# Patient Record
Sex: Female | Born: 1955 | Race: Black or African American | Hispanic: No | Smoking: Former smoker
Health system: Southern US, Community
[De-identification: ages and names within clinical notes are randomized; demographics above are authoritative.]

## PROBLEM LIST (undated history)

## (undated) DIAGNOSIS — N289 Disorder of kidney and ureter, unspecified: Secondary | ICD-10-CM

## (undated) DIAGNOSIS — I509 Heart failure, unspecified: Secondary | ICD-10-CM

## (undated) DIAGNOSIS — M722 Plantar fascial fibromatosis: Secondary | ICD-10-CM

## (undated) DIAGNOSIS — M109 Gout, unspecified: Secondary | ICD-10-CM

## (undated) DIAGNOSIS — I10 Essential (primary) hypertension: Secondary | ICD-10-CM

## (undated) DIAGNOSIS — G629 Polyneuropathy, unspecified: Secondary | ICD-10-CM

## (undated) DIAGNOSIS — J45909 Unspecified asthma, uncomplicated: Secondary | ICD-10-CM

## (undated) HISTORY — PX: COLONOSCOPY: SHX174

## (undated) HISTORY — PX: AV FISTULA PLACEMENT: SHX1204

## (undated) HISTORY — PX: ABDOMINAL HYSTERECTOMY: SHX81

---

## 2001-05-07 ENCOUNTER — Emergency Department (HOSPITAL_COMMUNITY): Admission: EM | Admit: 2001-05-07 | Discharge: 2001-05-07 | Payer: Self-pay | Admitting: *Deleted

## 2001-05-07 ENCOUNTER — Encounter: Payer: Self-pay | Admitting: *Deleted

## 2001-05-09 ENCOUNTER — Ambulatory Visit (HOSPITAL_COMMUNITY): Admission: RE | Admit: 2001-05-09 | Discharge: 2001-05-09 | Payer: Self-pay | Admitting: Internal Medicine

## 2001-05-09 ENCOUNTER — Encounter: Payer: Self-pay | Admitting: Internal Medicine

## 2001-05-19 ENCOUNTER — Emergency Department (HOSPITAL_COMMUNITY): Admission: EM | Admit: 2001-05-19 | Discharge: 2001-05-19 | Payer: Self-pay | Admitting: Emergency Medicine

## 2001-05-23 ENCOUNTER — Ambulatory Visit (HOSPITAL_COMMUNITY): Admission: RE | Admit: 2001-05-23 | Discharge: 2001-05-23 | Payer: Self-pay | Admitting: Internal Medicine

## 2001-05-26 ENCOUNTER — Ambulatory Visit (HOSPITAL_COMMUNITY): Admission: RE | Admit: 2001-05-26 | Discharge: 2001-05-26 | Payer: Self-pay | Admitting: Internal Medicine

## 2002-04-28 ENCOUNTER — Emergency Department (HOSPITAL_COMMUNITY): Admission: EM | Admit: 2002-04-28 | Discharge: 2002-04-28 | Payer: Self-pay | Admitting: *Deleted

## 2002-06-11 ENCOUNTER — Encounter (HOSPITAL_COMMUNITY): Admission: RE | Admit: 2002-06-11 | Discharge: 2002-07-11 | Payer: Self-pay | Admitting: Rheumatology

## 2002-07-15 ENCOUNTER — Encounter (HOSPITAL_COMMUNITY): Admission: RE | Admit: 2002-07-15 | Discharge: 2002-08-14 | Payer: Self-pay | Admitting: Rheumatology

## 2002-07-15 ENCOUNTER — Encounter: Payer: Self-pay | Admitting: Rheumatology

## 2002-08-04 ENCOUNTER — Encounter: Payer: Self-pay | Admitting: Emergency Medicine

## 2002-08-04 ENCOUNTER — Inpatient Hospital Stay (HOSPITAL_COMMUNITY): Admission: EM | Admit: 2002-08-04 | Discharge: 2002-08-07 | Payer: Self-pay | Admitting: Emergency Medicine

## 2002-08-06 ENCOUNTER — Encounter: Payer: Self-pay | Admitting: *Deleted

## 2002-08-09 ENCOUNTER — Emergency Department (HOSPITAL_COMMUNITY): Admission: EM | Admit: 2002-08-09 | Discharge: 2002-08-09 | Payer: Self-pay | Admitting: Emergency Medicine

## 2002-08-14 ENCOUNTER — Ambulatory Visit (HOSPITAL_COMMUNITY): Admission: RE | Admit: 2002-08-14 | Discharge: 2002-08-14 | Payer: Self-pay | Admitting: Internal Medicine

## 2002-08-14 ENCOUNTER — Encounter: Payer: Self-pay | Admitting: Internal Medicine

## 2002-08-20 ENCOUNTER — Encounter (HOSPITAL_COMMUNITY): Admission: RE | Admit: 2002-08-20 | Discharge: 2002-09-19 | Payer: Self-pay | Admitting: Rheumatology

## 2003-05-27 ENCOUNTER — Emergency Department (HOSPITAL_COMMUNITY): Admission: EM | Admit: 2003-05-27 | Discharge: 2003-05-27 | Payer: Self-pay | Admitting: *Deleted

## 2003-11-15 ENCOUNTER — Ambulatory Visit (HOSPITAL_COMMUNITY): Admission: RE | Admit: 2003-11-15 | Discharge: 2003-11-15 | Payer: Self-pay | Admitting: Unknown Physician Specialty

## 2004-06-16 ENCOUNTER — Emergency Department (HOSPITAL_COMMUNITY): Admission: EM | Admit: 2004-06-16 | Discharge: 2004-06-16 | Payer: Self-pay | Admitting: Emergency Medicine

## 2009-02-07 ENCOUNTER — Ambulatory Visit (HOSPITAL_COMMUNITY): Admission: RE | Admit: 2009-02-07 | Discharge: 2009-02-07 | Payer: Self-pay | Admitting: Nephrology

## 2009-06-05 ENCOUNTER — Emergency Department (HOSPITAL_COMMUNITY): Admission: EM | Admit: 2009-06-05 | Discharge: 2009-06-05 | Payer: Self-pay | Admitting: Emergency Medicine

## 2011-02-18 LAB — POCT I-STAT, CHEM 8
Calcium, Ion: 1.22 mmol/L (ref 1.12–1.32)
Glucose, Bld: 115 mg/dL — ABNORMAL HIGH (ref 70–99)
HCT: 36 % (ref 36.0–46.0)
Hemoglobin: 12.2 g/dL (ref 12.0–15.0)
TCO2: 23 mmol/L (ref 0–100)

## 2011-03-30 NOTE — Discharge Summary (Signed)
   NAME:  Julie [name], Julie [name]                         ACCOUNT NO.:  [id number]   MEDICAL RECORD NO.:  [id number]                   PATIENT TYPE:  INP   LOCATION:  2003                                 FACILITY:  [facility]   PHYSICIAN:  Tesfaye D. Legrand Rams, M.D.              DATE OF BIRTH:  [birth year]   DATE OF ADMISSION:  08/04/2002  DATE OF DISCHARGE:  08/07/2002                                 DISCHARGE SUMMARY   DISCHARGE DIAGNOSES:  1. Chest pain with positive stress test finding.  2. Hypertension.  3. Arthritis.  4. Obesity.  5. Nicotine addiction.   DISPOSITION:  The patient was transferred to [facility] for cardiac  catheterization and further evaluation.   HOSPITAL COURSE:  This is a 55 year old black female with a history of  hypertension and morbid obesity admitted to [facility] with  complaint of chest pain which was radiating to her left arm.  She was  admitted under telemetry and serial EKG and cardiac enzymes were done.  The  EKG and cardiac enzymes were negative for acute ischemic changes.  She had a  stress test which was done by cardiology.  The stress test was said to be  positive and she was transferred to [facility] for cardiac  catheterization and further evaluation.                                               Tesfaye D. Legrand Rams, M.D.    TDF/MEDQ  D:  09/09/2002  T:  09/10/2002  Job:  [id number]

## 2011-03-30 NOTE — Consult Note (Signed)
NAME:  Julie [name], Julie [name] NO.:  [id number]   MEDICAL RECORD NO.:  [id number]                   PATIENT TYPE:   LOCATION:                                       FACILITY:   PHYSICIAN:  Lindaann Slough, M.D.            DATE OF BIRTH:   DATE OF CONSULTATION:  DATE OF DISCHARGE:                                   CONSULTATION   CHIEF COMPLAINT:  Joint pain, stiff.   REASON FOR CONSULTATION:  Julie [name] is a 55 year old black female who has  had several months of aching and hurting in the bilateral knees. She had an  MRI of one of the knees in 1998 but I cannot find the results of this  examination. She finds that going up steps makes her knees worse. She has  had some locking or catching sensation to one or both of the knees  occasionally. She feels that they have not particularly been swollen. The  pain can go down the legs. She reports that her hands hurt and there has  been some swelling. She has had a nerve conduction study and tells me that  there is some nerve damage or tendonitis to the little finger side of the  right hand. She has stiff joints for about one hour in the morning. Her back  will hurt somewhat frequently but the pain goes away quickly.   REVIEW OF SYSTEMS:  She denied fever, rashes, or any weight loss. Her energy  level is described as pretty good. She has some pain that will cause  disrupted sleep occasionally. She has headaches two to three times a week.  She also describes having an alternating pattern of diarrhea and  constipation but no blood or mucous to the bowel movement. She has some  slight chest pain. Denies shortness of breath.   PAST MEDICAL/SURGICAL HISTORY:  1. Hysterectomy.  2. Hypertension.  3. Her blood pressure problems are severe.   LABORATORY DATA:  Creatinine 2.3 in June of 2002. This improved to 1.6 in  October 2002. In June 2002 WBC was 7.1, HGB 13.9, TLT 267. She has had an  ultrasound of the kidneys  showing normal cortexes and she has a horseshoe  type of kidney.   MEDICATIONS:  1. Lotensin 40 mg daily.  2. Triamterine/HCTZ 75/50 mg daily.  3. ? Premarin.   ALLERGIES:  No known drug allergies.   FAMILY HISTORY:  Her father is a 33 year old and is not doing well with  heart problems. Her mother is a 36 year old and has diabetes mellitus,  hypertension, and a diagnosis of RA.   SOCIAL HISTORY:  She grew up in [place] and spent her last teenage years in  [place]. She is married and has children. 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 She smoked cigarettes for many years and  quit four to five months ago. No alcohol.  PHYSICAL EXAMINATION:  VITAL SIGNS: Weight 256 pounds. Blood pressure  170/108, respiratory rate 16, pulse 74.  GENERAL: Overall healthy appearing.  SKIN: Clear.  HEENT: Pupils are equal, round, and reactive to light and accommodation.  Extraocular muscles intact. Mouth clear.  NECK: No jugular venous distention.  LUNGS: Clear.  HEART: Regular rate and rhythm.  No murmur, rub, or gallop.  ABDOMEN: Obese, soft, nontender.  MS: The hands show no arthritic swelling. Are cool and nontender. Wrist,  elbow, shoulders, and neck with good range of motion  and there was no  resistance. Trigger points around the shoulder, neck, upper paraspinous  muscles were mildly tender. Hips with good range of motion. The right knee  was cool. She had no effusions and there was no tenderness with full flexion  to 130 degrees. There was mild lateral joint line tenderness. The left knee  was also cool. There was more moderate medial joint line tenderness. There  was no effusion and the stability was good. There was no pain with full  flexion at 130 degrees. The ankles showed no edema and were nontender. The  feet were mildly tender but not arthritically swollen.  NEURO: Strength is 5/5. DTRs are 2+ throughout with negative SLR.   ASSESSMENT/PLAN:  1. Knee  pain. We will have the knees x-rayed to rule out significant     osteoarthritis. The cause of the prior elevations of the creatinine, I     would keep her off NSAIDS until we know what this status is. I have given     her a prescription for Darvocet N-100 one three times a day as needed. I     am also setting her up for PT to run some strengthening exercises for the     thighs to help the knees. She does have the history of locking but the     examination seemed quite good and the joint line tenderness really was     quite mild presently.  2. Hypertension with prior CRI. We will check a CBC and C-met today. We did     call the pharmacy where she picks up her medicines and what we find is     that she picked up her Lotensin on November 13, 2001, receiving #30 tabs.     The next time she got the prescription filled was on April 17, 2002 and got     #30 tabs. She also got #30 tabs of Norvasc 30 mg on February 04, 2002 and     the only other prescription obtained during this period of time was     Premarin one time. I have asked her about this and she says that she has     not been taking her medicines. I have spent extra time counseling with     her, the importance of taking the medicine in hopes of not eventually     going on dialysis.  3. Obesity. I have encouraged her to try and lose some weight. I have     suggested cutting back a slight degree to where she might lose one or two     pounds a month. Over a year, this could be a significant approximate 25     pound weight loss.   Thank you Dr. Legrand Rams for this consultation. Julie [name] knee examination  overall, looks good. I will see how she does with the Darvocet and PT. I am  hoping that we will avoid the need for  an MRI. I would give some  consideration to a knee injection. She will return in two months.                                               Lindaann Slough, M.D.   WWT/MEDQ  D:  07/09/2002  T:  07/10/2002  Job:  [id number]   cc:    Tesfaye D. Legrand Rams, M.D.

## 2011-03-30 NOTE — Consult Note (Signed)
NAME:  Julie [name], Julie [name]                         ACCOUNT NO.:  [id number]   MEDICAL RECORD NO.:  [id number]                   PATIENT TYPE:  INP   LOCATION:  A206                                 FACILITY:  [facility]   PHYSICIAN:  Signa Kell, M.D. [facility]         DATE OF BIRTH:  07-Jul-1956   DATE OF CONSULTATION:  08/05/2002  DATE OF DISCHARGE:                                   CONSULTATION   REASON FOR CONSULTATION:  The patient is a pleasant 55 year old divorced  black female admitted on August 04, 2002 with chest and left arm pain  associated with severe nausea with some dizziness.  The pain resolved after  four to five minutes but she felt somewhat weak for the next hour or so.  The patient has no exertional symptoms.  She did have a somewhat similar  episode a year ago, at which time she had an echocardiogram.  We do not have  these records.  She feels fine today with no symptoms.  Her EKGs have shown  no acute change except for slight T wave change in V1, which are probably  related to lead placement.  Enzymes have been negative.   PAST MEDICAL HISTORY:  Past history reveals she has hypertension, borderline  diabetes, hypercholesterolemia, GERD and partial hysterectomy.   MEDICATIONS:  Medications include Lortab, Norvasc 5 mg, aspirin, Lotensin 20  mg, hydrochlorothiazide 25 mg.   SOCIAL HISTORY:  She is divorced with four children.  She previously smoked.  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   FAMILY HISTORY:  Mother is 105 and took heart medications.  One brother had  MI at age 67.   REVIEW OF SYSTEMS:  HEENT:  Head, eyes, ears, nose and throat unremarkable.  CARDIORESPIRATORY:  AS noted above.  GU:  Negative.  GI:  Negative except as  above.  MUSCULOSKELETAL:  Arthralgias, joint swelling and pain.   ALLERGIES:  None.   PHYSICAL EXAMINATION:  VITAL SIGNS:  Blood pressure 112/59, pulse 70, normal  sinus rhythm, respirations 20.  GENERAL APPEARANCE:  She is obese, in no  distress.  HEENT:  Unremarkable.  NECK:  JVP is not elevated.  Carotid pulses are bounding and without bruits.  LUNGS:  Clear.  CARDIAC:  There is a 1/6 short systolic murmur at left sternal border.  No  other murmur.  ABDOMEN:  Unremarkable.  EXTREMITIES:  No edema.   LABORATORY AND ACCESSORY CLINICAL DATA:  EKG is within normal limits.   Chest x-ray revealed chronic bronchitis, no acute abnormality.   Labs:  CBC and BMET were normal.   IMPRESSION:  1. Chest pain, undetermined etiology, rule out coronary artery disease     versus esophageal disease.  2. Hypertension.  3. Borderline diabetes.  4. Family history of coronary artery disease.  5. Cigarettes.  6. Gastroesophageal reflux disease.  7. Hyperlipidemia.   Although the symptoms are somewhat worrisome, she has had one episode with  no electrocardiographic or enzyme changes and I have suggested stress  Cardiolite.  Certainly, if this were to be negative, we would not need to  consider catheterization.  If there is any question, cath should be done.   She should have lipid profile and if her LDL is not below [id number], certainly I  think a statin would be indicated.   I appreciate the opportunity to share in this nice patient's care.                                               Signa Kell, M.D. [facility]    EJL/MEDQ  D:  08/05/2002  T:  08/05/2002  Job:  [id number]

## 2011-03-30 NOTE — H&P (Signed)
NAME:  Julie [name], Julie [name]                         ACCOUNT NO.:  [id number]   MEDICAL RECORD NO.:  [id number]                   PATIENT TYPE:  INP   LOCATION:  A206                                 FACILITY:  [facility]   PHYSICIAN:  Tesfaye D. Legrand Rams, M.D.              DATE OF BIRTH:  1956-06-20   DATE OF ADMISSION:  08/04/2002  DATE OF DISCHARGE:                                HISTORY & PHYSICAL   CHIEF COMPLAINT:  Chest pain.   HISTORY OF PRESENT ILLNESS:  This is a 55 year old, African-American female  with known case of hypertension, obesity and nicotine addiction who came to  the emergency room with the above complaint.  The patient was in her usual  state of health until the day of admission when she suddenly developed left-  sided chest pain.  The pain was a pressure type and lasted for a few  minutes.  She went to her work place and started getting frequent pain.  She  was then brought to the emergency room where she was evaluated.  Her initial  EKG and cardiac enzymes were within normal limits.  She has some nausea and  headache.  No shortness of breath or cough noted.  Due to the patient's high  risk for coronary artery disease, it was decided to admit her and do serial  EKG and cardiac enzymes.   REVIEW OF SYMPTOMS:  GENERAL:  No fever or chills.  RESPIRATORY:  No cough,  shortness of breath or palpitations.  GI:  Nausea with no vomiting.  No  abdominal pain, dysuria, urgency or frequency of urination.   PAST MEDICAL HISTORY:  1. Hypertension.  2. Arthritis.  3. Obesity.  4. Nicotine addiction.   MEDICATIONS:  1. Lotensin HCT 20/25 one tablet p.o. q.d.  2. Norvasc 5 mg p.o. q.d.  3. Darvocet-N 100 one tablet p.o. q.6h. p.r.n.   SOCIAL HISTORY:  The patient is married.  She smokes 1/2 pack of cigarettes  every day for the last several years.  No history of alcohol or substance  abuse.   PHYSICAL EXAMINATION:  GENERAL:  The patient is alert, wake.  Obese.  VITAL SIGNS:   Blood pressure 110/70, pulse 67, respirations 24, temperature  97 degrees Fahrenheit.  HEENT:  Pupils equal round and reactive to light.  NECK:  Supple.  CHEST:  Decreased air entry with bilateral rhonchi with crackles.  HEART:  S1, S2 heard.  No murmur.  ABDOMEN:  Bowel sounds positive.  No organomegaly.  EXTREMITIES:  No leg edema.   ASSESSMENT:  This is a 55 year old, African-American female with history of  hypertension, morbid obesity and nicotine addiction admitted due to chest  pain.  Her initial electrocardiogram and cardiac enzymes were within normal  limits.  The patient is still a high risk for coronary artery disease.   PLAN:  1. Serial EKG and cardiac enzymes.  2. Cardiology consult for possible  stress test.  3. Continue the patient on regular medications.                                               Tesfaye D. Legrand Rams, M.D.    TDF/MEDQ  D:  08/05/2002  T:  08/05/2002  Job:  [id number]

## 2011-03-30 NOTE — Discharge Summary (Signed)
NAME:  Julie [name], Julie [name]                         ACCOUNT NO.:  [id number]   MEDICAL RECORD NO.:  [id number]                   PATIENT TYPE:  INP   LOCATION:  2003                                 FACILITY:  [facility]   PHYSICIAN:  Wallis Bamberg. Johnsie Cancel, M.D. [facility]           DATE OF BIRTH:  [birth year]   DATE OF ADMISSION:  08/07/2002  DATE OF DISCHARGE:  08/07/2002                           DISCHARGE SUMMARY - REFERRING   PROCEDURE:  1. Cardiac catheterization.  2. Coronary arteriogram.  3. Left ventriculogram.   HOSPITAL COURSE:  The patient is a 55 year old female with no known history  of coronary artery disease.  She went to [facility] on  August 04, 2002, for chest pain.  She was evaluated by cardiology there  and because of multiple risk factors including hypertension, borderline  diabetes, and family history of coronary artery disease as well as ongoing  tobacco abuse, it was felt that the best way to evaluate her pain was  cardiac catheterization.  She was transferred to [facility] for  further evaluation and cardiac catheterization.   On August 07, 2002, the patient was transferred to [facility]  for cardiac catheterization.  The catheterization showed no significant  coronary artery disease in the LAD, circumflex, or RCA systems.  Her EF was  approximately 80% with no regional wall motion abnormalities.  There was no  AS or no MR.  Post procedure, the patient's bed rest is pending completion,  but if she is ambulatory without difficulty after her bed rest is complete,  she will be considered stable for discharge on August 07, 2002, p.m.   CONDITION ON DISCHARGE:  Stable.   DISCHARGE DIAGNOSES:  1. Chest pain, no coronary artery disease by catheterization.  2. Hypertension.  3. Arthritis.  4. Obesity.  5. Ongoing nicotine use.  6. Borderline diabetes.  7. Status post partial hysterectomy.   DISCHARGE INSTRUCTIONS:  Her activity level  is to include no driving, sexual  or strenuous activity for two days. She is to stick to a low fat, low salt  diet.  She is to call the office for problems with the catheterization site.  She is to follow up with Dr. Legrand Rams as needed.  She is to follow up with Dr.  Velora Heckler and has P.A. visit scheduled for Friday, October 10, at 1 p.m. and  after that will follow up with primary care.   DISCHARGE MEDICATIONS:  1. Norvasc 5 mg q.d.  2. Aspirin q.d.  3.     Lotensin 20 mg q.d.  4. Hydrochlorothiazide 25 mg q.d.  5. Lortab p.r.n.     Davis Gourd, P.A. [place]. Johnsie Cancel, M.D. [facility]    RG/MEDQ  D:  08/07/2002  T:  08/10/2002  Job:  [id number]   cc:   Tesfaye D.  Legrand Rams, M.D.   Signa Kell, M.D. [facility]

## 2011-03-30 NOTE — Cardiovascular Report (Signed)
   NAME:  Julie [name], Julie [name]                         ACCOUNT NO.:  [id number]   MEDICAL RECORD NO.:  [id number]                   PATIENT TYPE:  INP   LOCATION:  2003                                 FACILITY:  [place]   PHYSICIAN:  Ethelle Lyon, M.D. [facility]         DATE OF BIRTH:  [birth year]   DATE OF PROCEDURE:  08/07/2002  DATE OF DISCHARGE:  08/07/2002                              CARDIAC CATHETERIZATION   PROCEDURE:  Left heart catheterization, left ventriculography, coronary  angiography.   INDICATIONS:  Chest pain, positive exercise test, nondiagnostic technique.   DESCRIPTION OF PROCEDURE:  Informed consent was obtained. Under 2% lidocaine  local anesthesia, a 6 French sheath was pushed into the right femoral artery  using the modified Seldinger approach. Angiography was performed using  JL-4  and JR-4 catheters. A 6 French pigtail catheter was advanced into the left  ventricle. Pressures were measured. Angiography was performed by hand  injection. The sheaths were removed in the catheterization suite following  the procedure.   COMPLICATIONS:  None.   FINDINGS:  1. Left ventricle:  Ejection fraction of approximately 80% without regional     wall motion abnormality.  2. No aortic stenosis on pullback.  3. No mitral regurgitation.  4. Left ventricular pressures 109/7/11.  5. Left main:  Angiographically normal.  6. Left anterior descending artery:  A large vessel giving rise to a single     large diagonal branch. It is angiographically normal.  7. Circumflex:  A large vessel giving rise to two large obtuse marginal     branches. It is angiographically normal.  8. Right coronary artery:  A small but dominant vessel. It is     angiographically normal.   IMPRESSION/PLAN:  The patient has angiographically normal coronary arteries.  We will therefore plan discharge home when  her bedrest is  complete  tonight. She is to return to her primary physician should her pain recur  for  further evaluation of noncardiac chest pain. She will be seen in our office  in the coming week for evaluation of her groin.                                               Ethelle Lyon, M.D. [facility]    WED/MEDQ  D:  08/07/2002  T:  08/11/2002  Job:  [id number]   cc:   Julie [name], M.D. [facility]   Dr. Allie [name]

## 2011-03-30 NOTE — Op Note (Signed)
   NAME:  TOMEKIA, BISKNER                         ACCOUNT NO.:  [id number]   MEDICAL RECORD NO.:  [id number]                   PATIENT TYPE:  INP   LOCATION:  A206                                 FACILITY:  [facility]   PHYSICIAN:  Marijo Conception. Wall, M.D. [facility]            DATE OF BIRTH:  02/18/1956   DATE OF PROCEDURE:  08/06/2002  DATE OF DISCHARGE:                                  PROCEDURE NOTE   PROCEDURE:  Stress Cardiolite.   CARDIOLOGIST:  Dr. Verl Blalock.   HISTORY OF PRESENT ILLNESS:  The patient is a 55 year old black female who  was admitted to [facility] on August 04, 2002, after she  developed some chest discomfort radiating into her left arm associated with  severe nausea and dizziness, eased with rest.  She states that this is the  first episode that she has had since last year.  Her risk factors include  hypertension, borderline diabetes, hyperlipidemia, obesity, early family  history, and tobacco use.   DESCRIPTION OF PROCEDURE:  Baseline EKG shows sinus bradycardia with a  ventricular rate of 51, early repolarization changes, blood pressure 120/86.  Utilizing the Bruce protocol, the patient ambulated for a total of 4 minutes  and 55 seconds, achieving 7.0 METS.  Maximum heart rate was 151, which was  86% of predicted maximum heart rate.  Maximum blood pressure was 148/92.  During test she did not have any acute EKG changes.  Recovery blood pressure  and heart rate improved quickly.  The test was discontinued secondary to  chest heaviness radiating into her left arm and leg fatigue.  Final results  and images are pending Dr. Winnifred Friar review.     Sharyl Nimrod, P.A. [place]. Verl Blalock, M.D. [facility]    EW/MEDQ  D:  08/06/2002  T:  08/07/2002  Job:  [id number]

## 2011-03-30 NOTE — Consult Note (Signed)
NAME:  Julie [name], Julie [name]                         ACCOUNT NO.:  [id number]   MEDICAL RECORD NO.:  [id number]                   PATIENT TYPE:  OUT   LOCATION:  RAD                                  FACILITY:  [facility]   PHYSICIAN:  Lindaann Slough, M.D.            DATE OF BIRTH:  1956-07-22   DATE OF CONSULTATION:  08/20/2002  DATE OF DISCHARGE:  08/14/2002                                   CONSULTATION   CHIEF COMPLAINT:  Knees.   HISTORY OF PRESENT ILLNESS:  Since seeing the patient, she has had some  chest pain with radiating pain into the left arm.  She has undergone a  cardiac catheterization and, she says, is fine.  She has also had a stress  test.  There is some discussion that she may have reflux symptoms.  Her  weight is up two pounds.  She is here today because of pain in her legs and  knees.  I reviewed her knee x-rays from July 15, 2002, which show  minimal degenerative-type changes, primarily at the tibial spines.  The  joint spaces are well maintained.  Laboratories checked on July 09, 2002,  show a glucose 85, creatinine 1.6, AST 21, albumin 3.6.  WBC 6.4, HGB 12.4,  platelets 211.   She still is having some difficulty with sleep.  She has been placed on some  sleep medicine for this.  There is also some consideration that her chest  pain may be related to anxiety.   MEDICATIONS:  1. Lotensin 40 mg q.d.  2. Triamterene/HCTZ q.d.  3. Darvocet as needed.   PHYSICAL EXAMINATION:  VITAL SIGNS:  Weight 258 pounds.  Blood pressure  112/72, respirations 16.  GENERAL:  No distress.  LUNGS:  Clear.  HEART:  Regular.  No murmur.  EXTREMITIES:  Lower extremities:  No edema.  MUSCULOSKELETAL:  The hands, wrists, elbows, shoulders, neck have a good  range of motion, and there is no swelling.  Trigger points around the  shoulder, neck, occiput, and anterior chest are tender.  The knees are cool  and flex without tenderness to 130 degrees.  The ankles and feet were  nontender.   ASSESSMENT AND PLAN:  1. Knee pain:  This is the primary area where she is aching.  I have given     her a prescription for Relafen 1000 mg q.d.  I do not believe she is     using much Darvocet.  2.     Hypertension:  I was pleased that her creatinine was 1.6.  I believe she is      taking blood pressure medicine at this time.   Her symptoms are stable, and I will return her care to Dr. Legrand Rams at this  time.  She will return on a p.r.n. basis.  Lindaann Slough, M.D.    WWT/MEDQ  D:  08/20/2002  T:  08/20/2002  Job:  [id number]   cc:   Tesfaye D. Legrand Rams, M.D.  [address]  [place]  [place] [address]  Fax: [phone]

## 2011-05-15 ENCOUNTER — Emergency Department (HOSPITAL_COMMUNITY)
Admission: EM | Admit: 2011-05-15 | Discharge: 2011-05-15 | Disposition: A | Discharge status: Home / Self Care | Payer: Self-pay | Attending: Emergency Medicine | Admitting: Emergency Medicine

## 2011-05-15 DIAGNOSIS — G589 Mononeuropathy, unspecified: Secondary | ICD-10-CM | POA: Insufficient documentation

## 2011-05-15 DIAGNOSIS — H60399 Other infective otitis externa, unspecified ear: Secondary | ICD-10-CM | POA: Insufficient documentation

## 2011-05-15 DIAGNOSIS — Z79899 Other long term (current) drug therapy: Secondary | ICD-10-CM | POA: Insufficient documentation

## 2011-05-15 DIAGNOSIS — H9209 Otalgia, unspecified ear: Secondary | ICD-10-CM | POA: Insufficient documentation

## 2011-05-15 DIAGNOSIS — E669 Obesity, unspecified: Secondary | ICD-10-CM | POA: Insufficient documentation

## 2011-05-15 DIAGNOSIS — I10 Essential (primary) hypertension: Secondary | ICD-10-CM | POA: Insufficient documentation

## 2011-05-19 ENCOUNTER — Emergency Department (HOSPITAL_COMMUNITY)
Admission: EM | Admit: 2011-05-19 | Discharge: 2011-05-19 | Disposition: A | Discharge status: Home / Self Care | Payer: Self-pay | Attending: Emergency Medicine | Admitting: Emergency Medicine

## 2011-05-19 DIAGNOSIS — M79609 Pain in unspecified limb: Secondary | ICD-10-CM | POA: Insufficient documentation

## 2011-05-19 DIAGNOSIS — R609 Edema, unspecified: Secondary | ICD-10-CM | POA: Insufficient documentation

## 2011-05-19 LAB — POCT I-STAT, CHEM 8
Calcium, Ion: 1.19 mmol/L (ref 1.12–1.32)
HCT: 36 % (ref 36.0–46.0)
Hemoglobin: 12.2 g/dL (ref 12.0–15.0)
TCO2: 26 mmol/L (ref 0–100)

## 2011-05-19 LAB — URINALYSIS, ROUTINE W REFLEX MICROSCOPIC
Glucose, UA: NEGATIVE mg/dL
Ketones, ur: NEGATIVE mg/dL
Leukocytes, UA: NEGATIVE
Nitrite: NEGATIVE
Protein, ur: 100 mg/dL — AB
Urobilinogen, UA: 0.2 mg/dL (ref 0.0–1.0)

## 2011-05-19 LAB — URINE MICROSCOPIC-ADD ON

## 2011-08-19 ENCOUNTER — Emergency Department (HOSPITAL_COMMUNITY)
Admission: EM | Admit: 2011-08-19 | Discharge: 2011-08-19 | Disposition: A | Discharge status: Home / Self Care | Payer: Self-pay | Attending: Emergency Medicine | Admitting: Emergency Medicine

## 2011-08-19 ENCOUNTER — Other Ambulatory Visit: Payer: Self-pay

## 2011-08-19 ENCOUNTER — Encounter: Payer: Self-pay | Admitting: Emergency Medicine

## 2011-08-19 ENCOUNTER — Emergency Department (HOSPITAL_COMMUNITY): Payer: Self-pay

## 2011-08-19 DIAGNOSIS — Z87891 Personal history of nicotine dependence: Secondary | ICD-10-CM | POA: Insufficient documentation

## 2011-08-19 DIAGNOSIS — R10813 Right lower quadrant abdominal tenderness: Secondary | ICD-10-CM | POA: Insufficient documentation

## 2011-08-19 DIAGNOSIS — R42 Dizziness and giddiness: Secondary | ICD-10-CM | POA: Insufficient documentation

## 2011-08-19 DIAGNOSIS — R61 Generalized hyperhidrosis: Secondary | ICD-10-CM | POA: Insufficient documentation

## 2011-08-19 DIAGNOSIS — R110 Nausea: Secondary | ICD-10-CM | POA: Insufficient documentation

## 2011-08-19 DIAGNOSIS — R51 Headache: Secondary | ICD-10-CM | POA: Insufficient documentation

## 2011-08-19 DIAGNOSIS — R112 Nausea with vomiting, unspecified: Secondary | ICD-10-CM | POA: Insufficient documentation

## 2011-08-19 HISTORY — DX: Essential (primary) hypertension: I10

## 2011-08-19 HISTORY — DX: Polyneuropathy, unspecified: G62.9

## 2011-08-19 HISTORY — DX: Disorder of kidney and ureter, unspecified: N28.9

## 2011-08-19 LAB — COMPREHENSIVE METABOLIC PANEL
ALT: 15 U/L (ref 0–35)
Alkaline Phosphatase: 60 U/L (ref 39–117)
CO2: 26 mEq/L (ref 19–32)
Chloride: 101 mEq/L (ref 96–112)
GFR calc Af Amer: 54 mL/min — ABNORMAL LOW (ref >90)
GFR calc non Af Amer: 47 mL/min — ABNORMAL LOW (ref >90)
Glucose, Bld: 126 mg/dL — ABNORMAL HIGH (ref 70–99)
Potassium: 3.7 mEq/L (ref 3.5–5.1)
Sodium: 136 mEq/L (ref 135–145)

## 2011-08-19 LAB — CBC
HCT: 34.5 % — ABNORMAL LOW (ref 36.0–46.0)
Hemoglobin: 11.2 g/dL — ABNORMAL LOW (ref 12.0–15.0)
MCHC: 32.5 g/dL (ref 30.0–36.0)

## 2011-08-19 LAB — URINALYSIS, ROUTINE W REFLEX MICROSCOPIC
Glucose, UA: NEGATIVE mg/dL
Protein, ur: 100 mg/dL — AB
Specific Gravity, Urine: 1.015 (ref 1.005–1.030)

## 2011-08-19 LAB — DIFFERENTIAL
Basophils Relative: 0 % (ref 0–1)
Monocytes Absolute: 0.4 10*3/uL (ref 0.1–1.0)
Monocytes Relative: 7 % (ref 3–12)
Neutro Abs: 3.9 10*3/uL (ref 1.7–7.7)

## 2011-08-19 MED ORDER — TRAMADOL HCL 50 MG PO TABS: 50.0000 mg | ORAL_TABLET | Freq: Four times a day (QID) | ORAL | Status: AC | PRN

## 2011-08-19 MED ORDER — PROMETHAZINE HCL 25 MG PO TABS: 25.0000 mg | ORAL_TABLET | Freq: Four times a day (QID) | ORAL | Status: AC | PRN

## 2011-08-19 MED ORDER — KETOROLAC TROMETHAMINE 30 MG/ML IJ SOLN
30.0000 mg | Freq: Once | INTRAMUSCULAR | Status: AC
  Administered 2011-08-19: 30 mg via INTRAVENOUS
  Filled 2011-08-19: qty 1

## 2011-08-19 MED ORDER — SODIUM CHLORIDE 0.9 % IV BOLUS (SEPSIS)
1000.0000 mL | Freq: Once | INTRAVENOUS | Status: AC
  Administered 2011-08-19: 1000 mL via INTRAVENOUS

## 2011-08-19 MED ORDER — METOCLOPRAMIDE HCL 5 MG/ML IJ SOLN
10.0000 mg | Freq: Once | INTRAMUSCULAR | Status: AC
  Administered 2011-08-19: 10 mg via INTRAVENOUS
  Filled 2011-08-19: qty 2

## 2011-08-19 NOTE — ED Provider Notes (Addendum)
History  Scribed for Dr.Rillie Riffel, the patient was seen in room APA12. The chart was scribed by Clarisa Fling. The patients care was started at 1606. CSN: [id number] Arrival date & time: 08/19/2011  2:47 PM  Chief Complaint  Patient presents with  . Headache  . Nausea  . Dizziness   Patient is a 55 y.o. female presenting with headaches.  Headache  This is a new problem. The current episode started 6 to 12 hours ago. The problem occurs constantly. The problem has not changed since onset.The headache is associated with nothing. The pain is located in the bilateral region. The quality of the pain is described as dull. The pain is at a severity of 5/10. The pain is moderate. The pain does not radiate. Associated symptoms include nausea and vomiting.   TEIARA JOHN is a 55 y.o. female with a history of HTN, Renal disorder, and Neuropathy, who presents to the Emergency Department complaining of headache. Additionally notes dizziness, weakness, N/V, and diaphoresis. States that similar symptoms presented last month. Pt has had abdominal hysterectomy. There are no other associated symptoms and no other alleviating or aggravating factors.  [facility]P: [facility]  PAST MEDICAL HISTORY:  Past Medical History  Diagnosis Date  . Hypertension   . Renal disorder   . Neuropathy      PAST SURGICAL HISTORY:  Past Surgical History  Procedure Date  . Abdominal hysterectomy      MEDICATIONS:  Previous Medications   No medications on file     ALLERGIES:  Allergies as of 08/19/2011  . (No Known Allergies)     FAMILY HISTORY:  Family History  Problem Relation Age of Onset  . Stroke Mother      SOCIAL HISTORY: History  Substance Use Topics  . Smoking status: Former Smoker -- 1.0 packs/day for 38 years    Types: Cigarettes    Quit date: 08/18/2004  . Smokeless tobacco: Never Used  . Alcohol Use: No     OB History    Grav Para Term Preterm Abortions TAB SAB Ect Mult Living   6 4 4  2  2    4       Review of Systems  Constitutional: Positive for diaphoresis.  Gastrointestinal: Positive for nausea and vomiting.  Neurological: Positive for dizziness and headaches.  All other systems reviewed and are negative.     Allergies  Review of patient's allergies indicates no known allergies.  Home Medications  No current outpatient prescriptions on file.  BP 133/86  Pulse 83  Temp(Src) 97.5 F (36.4 C) (Oral)  Resp 18  Ht 5\' 6"  (1.676 m)  Wt 280 lb (127.007 kg)  BMI 45.19 kg/m2  SpO2 98%  Physical Exam  Constitutional: She is oriented to person, place, and time. She appears well-developed and well-nourished.  Non-toxic appearance. She does not have a sickly appearance.       Obese   HENT:  Head: Normocephalic and atraumatic.  Eyes: Conjunctivae, EOM and lids are normal. Pupils are equal, round, and reactive to light. No scleral icterus.  Neck: Trachea normal and normal range of motion. Neck supple.  Cardiovascular: Regular rhythm and normal heart sounds.   Pulmonary/Chest: Effort normal and breath sounds normal.  Abdominal: Soft. Normal appearance. There is tenderness (RLQ). There is no rebound, no guarding and no CVA tenderness.  Musculoskeletal: Normal range of motion.  Neurological: She is alert and oriented to person, place, and time. She has normal strength.  Skin: Skin is warm, dry  and intact. No rash noted.    ED Course  Procedures  OTHER DATA REVIEWED: Nursing notes, vital signs, and past medical records reviewed.   DIAGNOSTIC STUDIES: Oxygen Saturation is 98% on room air, normal by my interpretation.    LABS:  Results for orders placed during the hospital encounter of 08/19/11  CBC      Component Value Range   WBC 6.3  4.0 - 10.5 (K/uL)   RBC 4.17  3.87 - 5.11 (MIL/uL)   Hemoglobin 11.2 (*) 12.0 - 15.0 (g/dL)   HCT 34.5 (*) 36.0 - 46.0 (%)   MCV 82.7  78.0 - 100.0 (fL)   MCH 26.9  26.0 - 34.0 (pg)   MCHC 32.5  30.0 - 36.0 (g/dL)   RDW 14.5   11.5 - 15.5 (%)   Platelets 241  150 - 400 (K/uL)  DIFFERENTIAL      Component Value Range   Neutrophils Relative 62  43 - 77 (%)   Neutro Abs 3.9  1.7 - 7.7 (K/uL)   Lymphocytes Relative 30  12 - 46 (%)   Lymphs Abs 1.9  0.7 - 4.0 (K/uL)   Monocytes Relative 7  3 - 12 (%)   Monocytes Absolute 0.4  0.1 - 1.0 (K/uL)   Eosinophils Relative 1  0 - 5 (%)   Eosinophils Absolute 0.1  0.0 - 0.7 (K/uL)   Basophils Relative 0  0 - 1 (%)   Basophils Absolute 0.0  0.0 - 0.1 (K/uL)  URINALYSIS, ROUTINE W REFLEX MICROSCOPIC      Component Value Range   Color, Urine STRAW (*) YELLOW    Appearance CLEAR  CLEAR    Specific Gravity, Urine 1.015  1.005 - 1.030    pH 6.0  5.0 - 8.0    Glucose, UA NEGATIVE  NEGATIVE (mg/dL)   Hgb urine dipstick TRACE (*) NEGATIVE    Bilirubin Urine NEGATIVE  NEGATIVE    Ketones, ur NEGATIVE  NEGATIVE (mg/dL)   Protein, ur 100 (*) NEGATIVE (mg/dL)   Urobilinogen, UA 0.2  0.0 - 1.0 (mg/dL)   Nitrite NEGATIVE  NEGATIVE    Leukocytes, UA NEGATIVE  NEGATIVE   COMPREHENSIVE METABOLIC PANEL      Component Value Range   Sodium 136  135 - 145 (mEq/L)   Potassium 3.7  3.5 - 5.1 (mEq/L)   Chloride 101  96 - 112 (mEq/L)   CO2 26  19 - 32 (mEq/L)   Glucose, Bld 126 (*) 70 - 99 (mg/dL)   BUN 21  6 - 23 (mg/dL)   Creatinine, Ser 1.27 (*) 0.50 - 1.10 (mg/dL)   Calcium 9.3  8.4 - 10.5 (mg/dL)   Total Protein 7.0  6.0 - 8.3 (g/dL)   Albumin 3.4 (*) 3.5 - 5.2 (g/dL)   AST 20  0 - 37 (U/L)   ALT 15  0 - 35 (U/L)   Alkaline Phosphatase 60  39 - 117 (U/L)   Total Bilirubin 0.2 (*) 0.3 - 1.2 (mg/dL)   GFR calc non Af Amer 47 (*) >90 (mL/min)   GFR calc Af Amer 54 (*) >90 (mL/min)  URINE MICROSCOPIC-ADD ON      Component Value Range   WBC, UA 0-2  <3 (WBC/hpf)   RBC / HPF 0-2  <3 (RBC/hpf)     RADIOLOGY: CT Head Wo Contrast IMPRESSION: 1. No acute intracranial abnormalities. Original Report Authenticated By: Angelita Ingles, M.D.  DG Abd Acute W/  Chest IMPRESSION: No acute abnormality. Normal  bowel gas pattern. Original Report Authenticated By: Dereck Ligas, M.D.     ED COURSE / [place]: [id number]:  - Patient evaluated by ED physician, Toradol, Reglan, CT Head, DG Abd Acute, and labs ordered  Headache  Stress or tension headache IMPRESSION: Diagnoses that have been ruled out:  Diagnoses that are still under consideration:  Final diagnoses:    PLAN:  Home The patient is to return the emergency department if there is any worsening of symptoms. I have reviewed the discharge instructions with the patient.  CONDITION ON DISCHARGE: Stable  MEDICATIONS GIVEN IN THE E.D. Medications - No data to display  DISCHARGE MEDICATIONS: New Prescriptions   No medications on file   . Date: 08/19/2011  Rate:71  Rhythm: normal sinus rhythm  QRS Axis: normal  Intervals: normal  ST/T Wave abnormalities: normal    Conduction Disutrbances:none  Narrative Interpretation: poor r wave progression  Old EKG Reviewed: none available   SCRIBE ATTESTATION: The chart was scribed for me under my direct supervision.  I personally performed the history, physical, and medical decision making and all procedures in the evaluation of this patient.Maudry Diego, MD 08/19/11 1912  Maudry Diego, MD 08/31/11 0000

## 2011-08-19 NOTE — ED Notes (Signed)
Patient c/o headache, dizziness, nausea, and diaphoresis.  Patient does report vomiting small amount this morning.

## 2011-08-19 NOTE — ED Notes (Signed)
Pt left the er stating  No needs

## 2012-08-01 ENCOUNTER — Emergency Department (HOSPITAL_COMMUNITY): Payer: Self-pay

## 2012-08-01 ENCOUNTER — Emergency Department (HOSPITAL_COMMUNITY)
Admission: EM | Admit: 2012-08-01 | Discharge: 2012-08-01 | Disposition: A | Discharge status: Home / Self Care | Payer: Self-pay | Attending: Emergency Medicine | Admitting: Emergency Medicine

## 2012-08-01 ENCOUNTER — Encounter (HOSPITAL_COMMUNITY): Payer: Self-pay | Admitting: Emergency Medicine

## 2012-08-01 DIAGNOSIS — Z87891 Personal history of nicotine dependence: Secondary | ICD-10-CM | POA: Insufficient documentation

## 2012-08-01 DIAGNOSIS — M7730 Calcaneal spur, unspecified foot: Secondary | ICD-10-CM | POA: Insufficient documentation

## 2012-08-01 DIAGNOSIS — G589 Mononeuropathy, unspecified: Secondary | ICD-10-CM | POA: Insufficient documentation

## 2012-08-01 DIAGNOSIS — I10 Essential (primary) hypertension: Secondary | ICD-10-CM | POA: Insufficient documentation

## 2012-08-01 DIAGNOSIS — M79609 Pain in unspecified limb: Secondary | ICD-10-CM | POA: Insufficient documentation

## 2012-08-01 DIAGNOSIS — M79671 Pain in right foot: Secondary | ICD-10-CM

## 2012-08-01 MED ORDER — OXYCODONE-ACETAMINOPHEN 5-325 MG PO TABS: 1.0000 | ORAL_TABLET | Freq: Four times a day (QID) | ORAL | Status: DC | PRN

## 2012-08-01 NOTE — ED Notes (Signed)
Pt c/o increased pain in right foot x 2 days; pt sts hx of similar; pt denies obvious injury

## 2012-08-01 NOTE — ED Notes (Signed)
Dr. Pickering back in to speak with patient.  

## 2012-08-01 NOTE — Progress Notes (Signed)
Orthopedic Tech Progress Note Patient Details:  Julie [name] [birth year] [id number]  Ortho Devices Type of Ortho Device: CAM walker Ortho Device/Splint Location: right foot Ortho Device/Splint Interventions: Application   Blaine Guiffre 08/01/2012, 5:55 PM

## 2012-08-01 NOTE — ED Notes (Signed)
Pt states, "It starts hurting in my heel & goes up my foot to my toes. I have kidney failure & they are watching my kidney levels. I have been told I have uric acid build up."

## 2012-08-01 NOTE — ED Provider Notes (Signed)
History  This chart was scribed for [employer]. Alvino Chapel, MD by Roe Coombs. The patient was seen in room TR10C/TR10C. Patient's care was started at 1631.     CSN: [id number]  Arrival date & time 08/01/12  1539   First MD Initiated Contact with Patient 08/01/12 1631      Chief Complaint  Patient presents with  . Foot Pain    The history is provided by the patient. No language interpreter was used.    Julie [name] is a 56 y.o. female who presents to the Emergency Department complaining of sharp, moderate constant, non-radiating right foot pain onset yesterday. Patient denise any obvious injury or trauma. Patient has had this pain before. She hasn't sought treatment or evaluation before. Patient denies chest pain or SOB. Patient hasn't taken any antibiotics recently.   Past Medical History  Diagnosis Date  . Hypertension   . Renal disorder   . Neuropathy     Past Surgical History  Procedure Date  . Abdominal hysterectomy     Family History  Problem Relation Age of Onset  . Stroke Mother     History  Substance Use Topics  . Smoking status: Former Smoker -- 1.0 packs/day for 38 years    Types: Cigarettes    Quit date: 08/18/2004  . Smokeless tobacco: Never Used  . Alcohol Use: No    OB History    Grav Para Term Preterm Abortions TAB SAB Ect Mult Living   6 4 4  2  2   4       Review of Systems  Respiratory: Negative for shortness of breath.   Cardiovascular: Negative for chest pain.  Musculoskeletal: Positive for myalgias.  All other systems reviewed and are negative.    Allergies  Review of patient's allergies indicates no known allergies.  Home Medications   Current Outpatient Rx  Name Route Sig Dispense Refill  . AMLODIPINE BESYLATE 10 MG PO TABS Oral Take 10 mg by mouth daily.      . CELECOXIB 200 MG PO CAPS Oral Take 200 mg by mouth 2 (two) times daily.      Marland Kitchen GABAPENTIN 300 MG PO CAPS Oral Take 300 mg by mouth daily.      Marland Kitchen OLMESARTAN  MEDOXOMIL-HCTZ 40-25 MG PO TABS Oral Take 1 tablet by mouth daily.      . OXYCODONE-ACETAMINOPHEN 5-325 MG PO TABS Oral Take 1-2 tablets by mouth every 6 (six) hours as needed for pain. 10 tablet 0    BP 156/121  Pulse 92  Temp 98.3 F (36.8 C) (Oral)  Resp 18  SpO2 100%  Physical Exam  Constitutional: She is oriented to person, place, and time. She appears well-developed and well-nourished.       Obese.  HENT:  Head: Normocephalic and atraumatic.  Cardiovascular: Normal rate and regular rhythm.   Pulmonary/Chest: Effort normal and breath sounds normal. No respiratory distress.  Abdominal: Soft. There is no tenderness.  Musculoskeletal: She exhibits tenderness.       Right foot: She exhibits tenderness.       Tenderness to right heel both posteriorly and inferiorly. No induration. No erythema. Neurovascularly intact. No swelling of the leg.   Neurological: She is alert and oriented to person, place, and time.  Skin: Skin is warm and dry.  Psychiatric: She has a normal mood and affect. Her behavior is normal.    ED Course  Procedures (including critical care time) COORDINATION OF CARE: 4:32pm- Patient informed of current  plan for treatment and evaluation and agrees with plan at this time.    Labs Reviewed - No data to display  Dg Foot Complete Right  08/01/2012  *RADIOLOGY REPORT*  Clinical Data: Right foot pain, no injury  RIGHT FOOT COMPLETE - 3+ VIEW  Comparison: None.  Findings: No fracture or dislocation is seen.  Mild degenerative changes of the dorsal midfoot.  Small posterior calcaneal enthesophyte.  The visualized soft tissues are unremarkable.  IMPRESSION: No acute osseous abnormality is seen.   Original Report Authenticated By: Julian Hy, M.D.      1. Foot pain, right       MDM  Patient with right heel pain for last 2 days. No trauma. X-ray shows some spurring. She be given a Cam Walker for comfort. She'll also be given pain medicines. She'll followup  with Ortho-Est needed. She does have some hypertension, which is likely due to the pain. She'll also need to follow with her primary care doctor for that.      I personally performed the services described in this documentation, which was scribed in my presence. The recorded information has been reviewed and considered.     Jasper Riling. Alvino Chapel, MD 08/01/12 [phone]

## 2013-01-05 ENCOUNTER — Emergency Department (HOSPITAL_COMMUNITY)
Admission: EM | Admit: 2013-01-05 | Discharge: 2013-01-06 | Disposition: A | Discharge status: Home / Self Care | Payer: BC Managed Care – PPO | Attending: Emergency Medicine | Admitting: Emergency Medicine

## 2013-01-05 ENCOUNTER — Encounter (HOSPITAL_COMMUNITY): Payer: Self-pay | Admitting: *Deleted

## 2013-01-05 ENCOUNTER — Emergency Department (HOSPITAL_COMMUNITY): Payer: BC Managed Care – PPO

## 2013-01-05 DIAGNOSIS — Z8669 Personal history of other diseases of the nervous system and sense organs: Secondary | ICD-10-CM | POA: Insufficient documentation

## 2013-01-05 DIAGNOSIS — Z79899 Other long term (current) drug therapy: Secondary | ICD-10-CM | POA: Insufficient documentation

## 2013-01-05 DIAGNOSIS — M79671 Pain in right foot: Secondary | ICD-10-CM

## 2013-01-05 DIAGNOSIS — Z87891 Personal history of nicotine dependence: Secondary | ICD-10-CM | POA: Insufficient documentation

## 2013-01-05 DIAGNOSIS — I10 Essential (primary) hypertension: Secondary | ICD-10-CM | POA: Insufficient documentation

## 2013-01-05 DIAGNOSIS — M79609 Pain in unspecified limb: Secondary | ICD-10-CM | POA: Insufficient documentation

## 2013-01-05 DIAGNOSIS — Z8739 Personal history of other diseases of the musculoskeletal system and connective tissue: Secondary | ICD-10-CM | POA: Insufficient documentation

## 2013-01-05 DIAGNOSIS — R0602 Shortness of breath: Secondary | ICD-10-CM

## 2013-01-05 HISTORY — DX: Gout, unspecified: M10.9

## 2013-01-05 HISTORY — DX: Plantar fascial fibromatosis: M72.2

## 2013-01-05 LAB — CBC
HCT: 33.8 % — ABNORMAL LOW (ref 36.0–46.0)
Hemoglobin: 10.7 g/dL — ABNORMAL LOW (ref 12.0–15.0)
MCH: 26.6 pg (ref 26.0–34.0)
MCHC: 31.7 g/dL (ref 30.0–36.0)
MCV: 84.1 fL (ref 78.0–100.0)
RDW: 14.5 % (ref 11.5–15.5)

## 2013-01-05 LAB — BASIC METABOLIC PANEL
BUN: 26 mg/dL — ABNORMAL HIGH (ref 6–23)
Calcium: 8.9 mg/dL (ref 8.4–10.5)
Creatinine, Ser: 1.95 mg/dL — ABNORMAL HIGH (ref 0.50–1.10)
GFR calc Af Amer: 32 mL/min — ABNORMAL LOW (ref >90)
GFR calc non Af Amer: 28 mL/min — ABNORMAL LOW (ref >90)
Glucose, Bld: 89 mg/dL (ref 70–99)
Potassium: 4.3 mEq/L (ref 3.5–5.1)

## 2013-01-05 LAB — TROPONIN I: Troponin I: <0.3 ng/mL (ref <0.30)

## 2013-01-05 MED ORDER — IPRATROPIUM BROMIDE 0.02 % IN SOLN
0.5000 mg | RESPIRATORY_TRACT | Status: DC
  Administered 2013-01-05 (×2): 0.5 mg via RESPIRATORY_TRACT
  Filled 2013-01-05 (×2): qty 2.5

## 2013-01-05 MED ORDER — ALBUTEROL SULFATE (5 MG/ML) 0.5% IN NEBU
2.5000 mg | INHALATION_SOLUTION | RESPIRATORY_TRACT | Status: DC
  Administered 2013-01-05 (×2): 2.5 mg via RESPIRATORY_TRACT
  Filled 2013-01-05 (×2): qty 0.5

## 2013-01-05 MED ORDER — HYDROCODONE-ACETAMINOPHEN 5-325 MG PO TABS
2.0000 | ORAL_TABLET | Freq: Once | ORAL | Status: AC
  Administered 2013-01-05: 2 via ORAL
  Filled 2013-01-05: qty 2

## 2013-01-05 MED ORDER — ALBUTEROL SULFATE HFA 108 (90 BASE) MCG/ACT IN AERS: 2.0000 | INHALATION_SPRAY | RESPIRATORY_TRACT | Status: AC | PRN

## 2013-01-05 MED ORDER — HYDROCODONE-ACETAMINOPHEN 5-325 MG PO TABS: 2.0000 | ORAL_TABLET | ORAL | Status: DC | PRN

## 2013-01-05 NOTE — ED Notes (Signed)
Pt c/o right foot swelling off and on for awhile, states this time swelling is worse, SOB also noted after walking to room, states SOB is all the time

## 2013-01-05 NOTE — ED Provider Notes (Signed)
History     CSN: [id number]  Arrival date & time 01/05/13  1908   None     Chief Complaint  Patient presents with  . Foot Pain    (Consider location/radiation/quality/duration/timing/severity/associated sxs/prior treatment) Patient is a 57 y.o. female presenting with lower extremity pain. The history is provided by the patient.  Foot Pain This is a recurrent problem. The current episode started yesterday. The problem occurs constantly. The problem has been unchanged. Pertinent negatives include no abdominal pain, chills, coughing or fever. Exacerbated by: Walking on the foot. She has tried nothing for the symptoms.    Past Medical History  Diagnosis Date  . Hypertension   . Renal disorder   . Neuropathy   . Gout   . Plantar fasciitis     Past Surgical History  Procedure Laterality Date  . Abdominal hysterectomy      Family History  Problem Relation Age of Onset  . Stroke Mother     History  Substance Use Topics  . Smoking status: Former Smoker -- 1.00 packs/day for 38 years    Types: Cigarettes    Quit date: 08/18/2004  . Smokeless tobacco: Never Used  . Alcohol Use: No    OB History   Grav Para Term Preterm Abortions TAB SAB Ect Mult Living   6 4 4  2  2   4       Review of Systems  Constitutional: Negative for fever and chills.  Respiratory: Negative for cough.   Gastrointestinal: Negative for abdominal pain.  All other systems reviewed and are negative.    Allergies  Review of patient's allergies indicates no known allergies.  Home Medications   Current Outpatient Rx  Name  Route  Sig  Dispense  Refill  . amLODipine (NORVASC) 10 MG tablet   Oral   Take 10 mg by mouth daily.           . celecoxib (CELEBREX) 200 MG capsule   Oral   Take 200 mg by mouth 2 (two) times daily.           Marland Kitchen gabapentin (NEURONTIN) 300 MG capsule   Oral   Take 300 mg by mouth daily.           Marland Kitchen olmesartan-hydrochlorothiazide (BENICAR HCT) 40-25 MG per  tablet   Oral   Take 1 tablet by mouth daily.           Marland Kitchen oxyCODONE-acetaminophen (PERCOCET/ROXICET) 5-325 MG per tablet   Oral   Take 1-2 tablets by mouth every 6 (six) hours as needed for pain.   10 tablet   0     BP 150/91  Pulse 87  Temp(Src) 98.2 F (36.8 C) (Oral)  Ht 5\' 2"  (1.575 m)  Wt 285 lb (129.275 kg)  BMI 52.11 kg/m2  SpO2 99%  Physical Exam  Nursing note and vitals reviewed. Constitutional: She is oriented to person, place, and time. She appears well-developed and well-nourished. No distress.  Morbidly obese  HENT:  Head: Normocephalic and atraumatic.  Eyes: EOM are normal. Pupils are equal, round, and reactive to light.  Neck: Normal range of motion. Neck supple.  Cardiovascular: Normal rate and regular rhythm.  Exam reveals no friction rub.   No murmur heard. Pulmonary/Chest: Breath sounds normal. She is in respiratory distress (very mild). She has no wheezes. She has no rales.  Tachypneic  Abdominal: Soft. She exhibits no distension. There is no tenderness. There is no rebound.  Musculoskeletal: She exhibits edema (1+,  non-pitting).       Right ankle: She exhibits decreased range of motion and swelling (1+, nonpitting edema).       Right foot: She exhibits tenderness (midfoot) and bony tenderness (Midfoot).  Neurological: She is alert and oriented to person, place, and time.  Skin: She is not diaphoretic.    ED Course  Procedures (including critical care time)  Labs Reviewed - No data to display Dg Chest 2 View  01/05/2013  *RADIOLOGY REPORT*  Clinical Data: Right lower extremity swelling, right foot pain, history hypertension, renal disorder  CHEST - 2 VIEW  Comparison: None  Findings: Borderline enlargement of cardiac silhouette. Tortuous aorta. Pulmonary vascularity normal. Lungs clear. No pleural effusion, pneumothorax or acute osseous findings.  IMPRESSION: No acute abnormalities.   Original Report Authenticated By: Lavonia Dana, M.D.    Dg Foot  Complete Right  01/05/2013  *RADIOLOGY REPORT*  Clinical Data: Right lower extremity swelling right foot pain, history hypertension, renal disorder, gout, plantar fasciitis  RIGHT FOOT COMPLETE - 3+ VIEW  Comparison: 08/01/2012  Findings: Diffuse soft tissue swelling. Bones appear mildly demineralized. Joint spaces preserved. Plantar and Achilles insertion calcaneal spurs. No acute fracture, dislocation or bone destruction. Intertarsal degenerative changes with spur formation also identified.  IMPRESSION: Significant soft tissue swelling. Scattered degenerative changes and calcaneal spur formation. No definite acute bony findings.   Original Report Authenticated By: Lavonia Dana, M.D.      1. Shortness of breath   2. Foot pain, right      Date: 01/05/2013  Rate: 73  Rhythm: normal sinus rhythm  QRS Axis: normal  Intervals: QT prolonged  ST/T Wave abnormalities: normal  Conduction Disutrbances:left bundle branch block  Narrative Interpretation:   Old EKG Reviewed: unchanged    MDM   Ms. Schiffner is a 57 year old female with history of renal failure, hypertension, gout. She presents with foot pain. For pain began with some swelling 2 days ago. She is more pain which is a lady on it. She denies any fevers. Here she is also complaining of some shortness of breath. She states she's always short of breath but is now more so. She says hasn't had his in her chest that lasted about an hour yesterday. Pain did not radiate. She thought it was indigestion and time. She is chest pain-free at this time. She's never had any history of coronary artery disease or congestive heart failure. She is on fluid pills which she takes for her peripheral edema, however states that she has more swelling in her legs normal. Here she is afebrile. She is normal heart rate. She has a normal  blood pressure. She is 99% on room air. When speaking, she gets easily winded. She states that this is worse in her normal. She is a  previous smoker, however does not have a diagnosis of COPD or asthma. She states she has had breathing treatments previously and would like one today. She states that her shortness of breath may be due to 45 pounds weight gain and she's had recently.  Due to patient's tachypnea and complaints of chest pain and swelling, I am concerned for possible worsening of her chronic diseases. I will check some basic labs including a BNP and troponin. We'll check an EKG and chest x-ray. I will give her a breathing treatment. However her foot give her some pain medicine.  EKG with left bundle with no other ischemic changes. Similar to previous.  Delta troponin negative. Mild elevation in BNP, no edema on  CXR. Improved after 2 breathing treatments, stating she feels much better. Patient stable for discharge, states she can [facility] with [facility], gave her Cardiology follow up for her mild BNP elevation. Given Rx for albuterol and instructed to f/u this week.   Evelina Bucy, MD 01/06/13 0111

## 2013-01-07 NOTE — ED Provider Notes (Signed)
I  reviewed the resident's note and I agree with the findings and plan.   Maudry Diego, MD 01/07/13 [phone]

## 2013-01-12 ENCOUNTER — Encounter: Payer: Self-pay | Admitting: *Deleted

## 2013-01-15 ENCOUNTER — Encounter: Payer: Self-pay | Admitting: Cardiovascular Disease

## 2013-01-15 ENCOUNTER — Ambulatory Visit (INDEPENDENT_AMBULATORY_CARE_PROVIDER_SITE_OTHER): Payer: BC Managed Care – PPO | Admitting: Cardiovascular Disease

## 2013-01-15 VITALS — BP 130/82 | HR 74 | Ht 66.0 in | Wt 305.1 lb

## 2013-01-15 DIAGNOSIS — R0600 Dyspnea, unspecified: Secondary | ICD-10-CM

## 2013-01-15 DIAGNOSIS — I509 Heart failure, unspecified: Secondary | ICD-10-CM

## 2013-01-15 DIAGNOSIS — I447 Left bundle-branch block, unspecified: Secondary | ICD-10-CM

## 2013-01-15 DIAGNOSIS — I10 Essential (primary) hypertension: Secondary | ICD-10-CM | POA: Insufficient documentation

## 2013-01-15 DIAGNOSIS — I5032 Chronic diastolic (congestive) heart failure: Secondary | ICD-10-CM | POA: Insufficient documentation

## 2013-01-15 DIAGNOSIS — R609 Edema, unspecified: Secondary | ICD-10-CM

## 2013-01-15 DIAGNOSIS — R0989 Other specified symptoms and signs involving the circulatory and respiratory systems: Secondary | ICD-10-CM

## 2013-01-15 DIAGNOSIS — R0609 Other forms of dyspnea: Secondary | ICD-10-CM

## 2013-01-15 NOTE — Patient Instructions (Addendum)
Your physician recommends that you schedule a follow-up appointment in: [place] physician has requested that you have an echocardiogram. Echocardiography is a painless test that uses sound waves to create images of your heart. It provides your doctor with information about the size and shape of your heart and how well your heart's chambers and valves are working. This procedure takes approximately one hour. There are no restrictions for this procedure.

## 2013-01-15 NOTE — Assessment & Plan Note (Signed)
New since 2012 Likely related to HTN.  R/O DCM  Echo

## 2013-01-15 NOTE — Assessment & Plan Note (Signed)
Not clear if dyspnea in ER was reactive airway disease or diastolic dysfunciton as breathing Rx and lasix helped.  CXR was NAD.  Echo to assess mitral inflows and LVH.  Continue Rx HTN and diuretics

## 2013-01-15 NOTE — Assessment & Plan Note (Signed)
Stressed compliance with meds and low sodium diet.  Echo to assess LVH

## 2013-01-15 NOTE — Progress Notes (Signed)
Patient ID: Julie [name], female   DOB: 08-Oct-1956, 57 y.o.   MRN: [id number] 57 yo obese black female with HTN  Seen in ER 2/24 with foot pain and edema  Edema is chronic.  Had BNP of 400 given one dose of lasix.  She is not always compliant with her BP meds.  Still has not f/u with ortho for foot.  Distant history of smoking.  Had false positive myovue in 2003 with resultant cath by Dr Albertine Patricia that was normal.  No history of CHF.  No chest pain.  Edema is chronic for years. No history of DVT.   ROS: Denies fever, malais, weight loss, blurry vision, decreased visual acuity, cough, sputum, SOB, hemoptysis, pleuritic pain, palpitaitons, heartburn, abdominal pain, melena, lower extremity edema, claudication, or rash.  All other systems reviewed and negative   General: Affect appropriate Healthy:  appears stated age 57: normal Neck supple with no adenopathy JVP normal no bruits no thyromegaly Lungs clear with no wheezing and good diaphragmatic motion Heart:  S1/S2 no murmur,rub, gallop or click PMI normal Abdomen: benighn, BS positve, no tenderness, no AAA no bruit.  No HSM or HJR Distal pulses intact with no bruits No edema Neuro non-focal Skin warm and dry No muscular weakness  Medications Current Outpatient Prescriptions  Medication Sig Dispense Refill  . acetaminophen (TYLENOL) 500 MG tablet Take 500-1,000 mg by mouth daily as needed for pain.      Marland Kitchen albuterol (PROVENTIL HFA;VENTOLIN HFA) 108 (90 BASE) MCG/ACT inhaler Inhale 2 puffs into the lungs every 4 (four) hours as needed for wheezing.  2 Inhaler  0  . amLODipine (NORVASC) 10 MG tablet Take 10 mg by mouth daily.        Marland Kitchen aspirin EC 81 MG tablet Take 81 mg by mouth daily.      Marland Kitchen atorvastatin (LIPITOR) 10 MG tablet Take 10 mg by mouth at bedtime.      . celecoxib (CELEBREX) 200 MG capsule Take 200 mg by mouth as needed.       . furosemide (LASIX) 20 MG tablet Take 20 mg by mouth 2 (two) times daily.      Marland Kitchen gabapentin  (NEURONTIN) 300 MG capsule Take 300 mg by mouth 2 (two) times daily.       Marland Kitchen HYDROcodone-acetaminophen (NORCO/VICODIN) 5-325 MG per tablet Take 2 tablets by mouth every 4 (four) hours as needed for pain.  6 tablet  0  . metoprolol succinate (TOPROL-XL) 25 MG 24 hr tablet Take 25 mg by mouth daily.      Marland Kitchen olmesartan-hydrochlorothiazide (BENICAR HCT) 40-25 MG per tablet Take 1 tablet by mouth daily.         No current facility-administered medications for this visit.    Allergies Review of patient's allergies indicates no known allergies.  Family History: Family History  Problem Relation Age of Onset  . Stroke Mother     Social History: History   Social History  . Marital Status: Divorced    Spouse Name: N/A    Number of Children: N/A  . Years of Education: N/A   Occupational History  . Not on file.   Social History Main Topics  . Smoking status: Former Smoker -- 1.00 packs/day for 38 years    Types: Cigarettes    Quit date: 08/18/2004  . Smokeless tobacco: Never Used  . Alcohol Use: No  . Drug Use: No  . Sexually Active: No   Other Topics Concern  . Not on file  Social History Narrative  . No narrative on file    Electrocardiogram:  NSR LBBB rate 73  2/24 new compared to 10/12 ECG  Assessment and Plan

## 2013-01-22 ENCOUNTER — Ambulatory Visit (HOSPITAL_COMMUNITY): Admission: RE | Admit: 2013-01-22 | Payer: BC Managed Care – PPO | Source: Ambulatory Visit

## 2013-02-04 ENCOUNTER — Ambulatory Visit (HOSPITAL_COMMUNITY)
Admission: RE | Admit: 2013-02-04 | Discharge: 2013-02-04 | Disposition: A | Discharge status: Home / Self Care | Payer: BC Managed Care – PPO | Source: Ambulatory Visit | Attending: Cardiovascular Disease | Admitting: Cardiovascular Disease

## 2013-02-04 DIAGNOSIS — I10 Essential (primary) hypertension: Secondary | ICD-10-CM | POA: Insufficient documentation

## 2013-02-04 DIAGNOSIS — I447 Left bundle-branch block, unspecified: Secondary | ICD-10-CM | POA: Insufficient documentation

## 2013-02-04 DIAGNOSIS — I517 Cardiomegaly: Secondary | ICD-10-CM

## 2013-02-04 DIAGNOSIS — R0609 Other forms of dyspnea: Secondary | ICD-10-CM | POA: Insufficient documentation

## 2013-02-04 DIAGNOSIS — R0989 Other specified symptoms and signs involving the circulatory and respiratory systems: Secondary | ICD-10-CM | POA: Insufficient documentation

## 2013-02-04 NOTE — Progress Notes (Signed)
*  PRELIMINARY RESULTS* Echocardiogram 2D Echocardiogram has been performed.  Tera Partridge 02/04/2013, 2:20 PM

## 2013-02-06 ENCOUNTER — Encounter: Payer: Self-pay | Admitting: *Deleted

## 2013-02-19 ENCOUNTER — Other Ambulatory Visit: Payer: Self-pay

## 2013-02-19 DIAGNOSIS — G4710 Hypersomnia, unspecified: Secondary | ICD-10-CM

## 2013-02-24 ENCOUNTER — Emergency Department (HOSPITAL_COMMUNITY)
Admission: EM | Admit: 2013-02-24 | Discharge: 2013-02-24 | Disposition: A | Discharge status: Home / Self Care | Payer: BC Managed Care – PPO | Attending: Emergency Medicine | Admitting: Emergency Medicine

## 2013-02-24 ENCOUNTER — Emergency Department (HOSPITAL_COMMUNITY): Payer: BC Managed Care – PPO

## 2013-02-24 ENCOUNTER — Encounter (HOSPITAL_COMMUNITY): Payer: Self-pay | Admitting: *Deleted

## 2013-02-24 DIAGNOSIS — Z862 Personal history of diseases of the blood and blood-forming organs and certain disorders involving the immune mechanism: Secondary | ICD-10-CM | POA: Insufficient documentation

## 2013-02-24 DIAGNOSIS — Z87448 Personal history of other diseases of urinary system: Secondary | ICD-10-CM | POA: Insufficient documentation

## 2013-02-24 DIAGNOSIS — Z87891 Personal history of nicotine dependence: Secondary | ICD-10-CM | POA: Insufficient documentation

## 2013-02-24 DIAGNOSIS — I10 Essential (primary) hypertension: Secondary | ICD-10-CM | POA: Insufficient documentation

## 2013-02-24 DIAGNOSIS — M19079 Primary osteoarthritis, unspecified ankle and foot: Secondary | ICD-10-CM | POA: Insufficient documentation

## 2013-02-24 DIAGNOSIS — Z8639 Personal history of other endocrine, nutritional and metabolic disease: Secondary | ICD-10-CM | POA: Insufficient documentation

## 2013-02-24 DIAGNOSIS — G589 Mononeuropathy, unspecified: Secondary | ICD-10-CM | POA: Insufficient documentation

## 2013-02-24 DIAGNOSIS — Z7982 Long term (current) use of aspirin: Secondary | ICD-10-CM | POA: Insufficient documentation

## 2013-02-24 DIAGNOSIS — Z79899 Other long term (current) drug therapy: Secondary | ICD-10-CM | POA: Insufficient documentation

## 2013-02-24 DIAGNOSIS — R0602 Shortness of breath: Secondary | ICD-10-CM | POA: Insufficient documentation

## 2013-02-24 DIAGNOSIS — Z8669 Personal history of other diseases of the nervous system and sense organs: Secondary | ICD-10-CM | POA: Insufficient documentation

## 2013-02-24 DIAGNOSIS — Z8739 Personal history of other diseases of the musculoskeletal system and connective tissue: Secondary | ICD-10-CM | POA: Insufficient documentation

## 2013-02-24 DIAGNOSIS — M19072 Primary osteoarthritis, left ankle and foot: Secondary | ICD-10-CM

## 2013-02-24 LAB — BASIC METABOLIC PANEL
BUN: 25 mg/dL — ABNORMAL HIGH (ref 6–23)
CO2: 26 mEq/L (ref 19–32)
Calcium: 9.4 mg/dL (ref 8.4–10.5)
Chloride: 102 mEq/L (ref 96–112)
Creatinine, Ser: 1.78 mg/dL — ABNORMAL HIGH (ref 0.50–1.10)
Glucose, Bld: 110 mg/dL — ABNORMAL HIGH (ref 70–99)

## 2013-02-24 MED ORDER — PREDNISONE 10 MG PO TABS: ORAL_TABLET | ORAL | Status: DC

## 2013-02-24 MED ORDER — HYDROCODONE-ACETAMINOPHEN 5-325 MG PO TABS: 1.0000 | ORAL_TABLET | ORAL | Status: DC | PRN

## 2013-02-24 NOTE — ED Provider Notes (Signed)
History     CSN: [id number]  Arrival date & time 02/24/13  [id number]   First MD Initiated Contact with Patient 02/24/13 [phone]      Chief Complaint  Patient presents with  . Foot Pain    (Consider location/radiation/quality/duration/timing/severity/associated sxs/prior treatment) Patient is a 57 y.o. female presenting with lower extremity pain. The history is provided by the patient.  Foot Pain This is a new problem. The current episode started in the past 7 days. The problem occurs constantly. The problem has been gradually worsening. Associated symptoms include arthralgias. Pertinent negatives include no abdominal pain, chest pain, coughing, neck pain or numbness. The symptoms are aggravated by standing and walking. She has tried acetaminophen for the symptoms. The treatment provided no relief.    Past Medical History  Diagnosis Date  . Hypertension   . Renal disorder   . Neuropathy   . Gout   . Plantar fasciitis     Past Surgical History  Procedure Laterality Date  . Abdominal hysterectomy      Family History  Problem Relation Age of Onset  . Stroke Mother     History  Substance Use Topics  . Smoking status: Former Smoker -- 1.00 packs/day for 38 years    Types: Cigarettes    Quit date: 08/18/2004  . Smokeless tobacco: Never Used  . Alcohol Use: No    OB History   Grav Para Term Preterm Abortions TAB SAB Ect Mult Living   6 4 4  2  2   4       Review of Systems  Constitutional: Negative for activity change.       All ROS Neg except as noted in HPI  HENT: Negative for nosebleeds and neck pain.   Eyes: Negative for photophobia and discharge.  Respiratory: Positive for shortness of breath. Negative for cough and wheezing.   Cardiovascular: Negative for chest pain and palpitations.  Gastrointestinal: Negative for abdominal pain and blood in stool.  Genitourinary: Negative for dysuria, frequency and hematuria.  Musculoskeletal: Positive for arthralgias. Negative  for back pain.  Skin: Negative.   Neurological: Negative for dizziness, seizures, speech difficulty and numbness.  Psychiatric/Behavioral: Negative for hallucinations and confusion.    Allergies  Review of patient's allergies indicates no known allergies.  Home Medications   Current Outpatient Rx  Name  Route  Sig  Dispense  Refill  . acetaminophen (TYLENOL) 500 MG tablet   Oral   Take 500-1,000 mg by mouth daily as needed for pain.         Marland Kitchen albuterol (PROVENTIL HFA;VENTOLIN HFA) 108 (90 BASE) MCG/ACT inhaler   Inhalation   Inhale 2 puffs into the lungs every 4 (four) hours as needed for wheezing.   2 Inhaler   0   . amLODipine (NORVASC) 10 MG tablet   Oral   Take 10 mg by mouth daily.           Marland Kitchen aspirin EC 81 MG tablet   Oral   Take 81 mg by mouth daily.         Marland Kitchen atorvastatin (LIPITOR) 10 MG tablet   Oral   Take 10 mg by mouth at bedtime.         . furosemide (LASIX) 20 MG tablet   Oral   Take 20 mg by mouth 2 (two) times daily.         Marland Kitchen gabapentin (NEURONTIN) 300 MG capsule   Oral   Take 300 mg by mouth 2 (two)  times daily.          . metoprolol succinate (TOPROL-XL) 25 MG 24 hr tablet   Oral   Take 25 mg by mouth daily.         Marland Kitchen olmesartan-hydrochlorothiazide (BENICAR HCT) 40-25 MG per tablet   Oral   Take 1 tablet by mouth daily.             BP 130/78  Pulse 72  Temp(Src) 98.5 F (36.9 C) (Oral)  Resp 20  Ht 5\' 3"  (1.6 m)  Wt 280 lb (127.007 kg)  BMI 49.61 kg/m2  SpO2 97%  Physical Exam  Nursing note and vitals reviewed. Constitutional: She is oriented to person, place, and time. She appears well-developed and well-nourished.  Non-toxic appearance.  HENT:  Head: Normocephalic.  Right Ear: Tympanic membrane and external ear normal.  Left Ear: Tympanic membrane and external ear normal.  Eyes: EOM and lids are normal. Pupils are equal, round, and reactive to light.  Neck: Normal range of motion. Neck supple. Carotid bruit is  not present.  Cardiovascular: Normal rate, regular rhythm, normal heart sounds, intact distal pulses and normal pulses.   Pulmonary/Chest: Breath sounds normal. No respiratory distress.  Abdominal: Soft. Bowel sounds are normal. There is no tenderness. There is no guarding.  Musculoskeletal: Normal range of motion.  There is 1+ to trace nonpitting edema of the lower extremities. There is puffiness of the left foot. There is pain and swelling to the lateral foot and lateral malleolus. There no puncture wounds of the plantar surface of the left foot. There no lesions between the toes. The Achilles tendon is intact. The dorsalis pedis pulse is 2+ bilaterally.  Lymphadenopathy:       Head (right side): No submandibular adenopathy present.       Head (left side): No submandibular adenopathy present.    She has no cervical adenopathy.  Neurological: She is alert and oriented to person, place, and time. She has normal strength. No cranial nerve deficit or sensory deficit.  Skin: Skin is warm and dry.  Psychiatric: She has a normal mood and affect. Her speech is normal.    ED Course  Procedures (including critical care time)  Labs Reviewed - No data to display No results found.   No diagnosis found.    MDM  I have reviewed nursing notes, vital signs, and all appropriate lab and imaging results for this patient. Pt has pain and swelling of the left foot. Xray of the foot reveals swelling, but no occult fx or fb. There is mid foot joint space narrowing noted, and degenerative changes with spurring also noted, Bun is elevated at 25, and Creat 1.78.  Pt advised to elevate the legs, and see the podiatry consultant. Rx for norco and prednisone given to the patient.       Lenox Ahr, PA-C 02/26/13 [phone]

## 2013-02-24 NOTE — ED Notes (Signed)
Woke up this morning with pain and swelling to left foot.  Denies injury.  Increased pain with weight bearing.

## 2013-02-27 NOTE — ED Provider Notes (Signed)
Medical screening examination/treatment/procedure(s) were performed by non-physician practitioner and as supervising physician I was immediately available for consultation/collaboration.   Maudry Diego, MD 02/27/13 [phone]

## 2013-04-12 DIAGNOSIS — I517 Cardiomegaly: Secondary | ICD-10-CM | POA: Insufficient documentation

## 2013-05-19 ENCOUNTER — Encounter (HOSPITAL_COMMUNITY): Payer: Self-pay | Admitting: *Deleted

## 2013-05-19 ENCOUNTER — Emergency Department (HOSPITAL_COMMUNITY): Payer: BC Managed Care – PPO

## 2013-05-19 ENCOUNTER — Emergency Department (HOSPITAL_COMMUNITY)
Admission: EM | Admit: 2013-05-19 | Discharge: 2013-05-19 | Disposition: A | Discharge status: Home / Self Care | Payer: BC Managed Care – PPO | Attending: Emergency Medicine | Admitting: Emergency Medicine

## 2013-05-19 DIAGNOSIS — J45909 Unspecified asthma, uncomplicated: Secondary | ICD-10-CM | POA: Insufficient documentation

## 2013-05-19 DIAGNOSIS — Z8739 Personal history of other diseases of the musculoskeletal system and connective tissue: Secondary | ICD-10-CM | POA: Insufficient documentation

## 2013-05-19 DIAGNOSIS — E669 Obesity, unspecified: Secondary | ICD-10-CM | POA: Insufficient documentation

## 2013-05-19 DIAGNOSIS — R209 Unspecified disturbances of skin sensation: Secondary | ICD-10-CM | POA: Insufficient documentation

## 2013-05-19 DIAGNOSIS — Z87448 Personal history of other diseases of urinary system: Secondary | ICD-10-CM | POA: Insufficient documentation

## 2013-05-19 DIAGNOSIS — I5030 Unspecified diastolic (congestive) heart failure: Secondary | ICD-10-CM | POA: Insufficient documentation

## 2013-05-19 DIAGNOSIS — Z7982 Long term (current) use of aspirin: Secondary | ICD-10-CM | POA: Insufficient documentation

## 2013-05-19 DIAGNOSIS — Z87891 Personal history of nicotine dependence: Secondary | ICD-10-CM | POA: Insufficient documentation

## 2013-05-19 DIAGNOSIS — I10 Essential (primary) hypertension: Secondary | ICD-10-CM | POA: Insufficient documentation

## 2013-05-19 DIAGNOSIS — K0381 Cracked tooth: Secondary | ICD-10-CM | POA: Insufficient documentation

## 2013-05-19 DIAGNOSIS — Z862 Personal history of diseases of the blood and blood-forming organs and certain disorders involving the immune mechanism: Secondary | ICD-10-CM | POA: Insufficient documentation

## 2013-05-19 DIAGNOSIS — Z8669 Personal history of other diseases of the nervous system and sense organs: Secondary | ICD-10-CM | POA: Insufficient documentation

## 2013-05-19 DIAGNOSIS — R079 Chest pain, unspecified: Secondary | ICD-10-CM

## 2013-05-19 DIAGNOSIS — Z8639 Personal history of other endocrine, nutritional and metabolic disease: Secondary | ICD-10-CM | POA: Insufficient documentation

## 2013-05-19 DIAGNOSIS — Z79899 Other long term (current) drug therapy: Secondary | ICD-10-CM | POA: Insufficient documentation

## 2013-05-19 HISTORY — DX: Unspecified asthma, uncomplicated: J45.909

## 2013-05-19 HISTORY — DX: Heart failure, unspecified: I50.9

## 2013-05-19 LAB — CBC
MCH: 27.1 pg (ref 26.0–34.0)
MCHC: 32.4 g/dL (ref 30.0–36.0)
Platelets: 251 10*3/uL (ref 150–400)

## 2013-05-19 LAB — PRO B NATRIURETIC PEPTIDE: Pro B Natriuretic peptide (BNP): 187.4 pg/mL — ABNORMAL HIGH (ref 0–125)

## 2013-05-19 NOTE — ED Provider Notes (Signed)
History    CSN: [id number] Arrival date & time 05/19/13  [id number]  First MD Initiated Contact with Patient 05/19/13 2002     Chief Complaint  Patient presents with  . Numbness  . Chest Pain   (Consider location/radiation/quality/duration/timing/severity/associated sxs/prior Treatment) HPI Comments: Julie [name] is a 57 y.o. female who states that she is having ongoing chest pain, for several months and today it was a little bit different. Her chest pain is usually reads about 1 minute, and sharp in character. Today, the pain feels more tight. It is again transient, lasting just a few minutes. There is no known provocation for the pain. There are no associated symptoms such as shortness of breath, sweating or nausea. He has not taken anything for the pain. She has a secondary complaint of; feeling like her right cheek spontaneously, elevated, and then went down after about 5 minutes. She also states that the right face "feels funny" but does not have altered touch sensation to the right cheek. She admits that she has a right-sided tooth problem that is associated with a cracked tooth. She states that one week ago, she felt like her tongue was falling back in her mouth and choking her. The tongue was not swelling. She denies headache, nausea, vomiting, ongoing shortness of breath, dyspnea on exertion, orthopnea, weakness, or dizziness. She recently had pulmonary function testing done at [facility]. She was seeing a cardiologist here in town, but has decided to not see them again. She had cardiac echo that showed grade 1 diastolic dysfunction. She states that she is taking all her medications, as prescribed. There are no known modifying factors.  Patient is a 57 y.o. female presenting with chest pain. The history is provided by the patient.  Chest Pain  Past Medical History  Diagnosis Date  . Hypertension   . Renal disorder   . Neuropathy   . Gout   . Plantar fasciitis   . CHF  (congestive heart failure)   . Asthma    Past Surgical History  Procedure Laterality Date  . Abdominal hysterectomy     Family History  Problem Relation Age of Onset  . Stroke Mother    History  Substance Use Topics  . Smoking status: Former Smoker -- 1.00 packs/day for 38 years    Types: Cigarettes    Quit date: 08/18/2004  . Smokeless tobacco: Never Used  . Alcohol Use: No   OB History   Grav Para Term Preterm Abortions TAB SAB Ect Mult Living   6 4 4  2  2   4      Review of Systems  Cardiovascular: Positive for chest pain.  All other systems reviewed and are negative.    Allergies  Review of patient's allergies indicates no known allergies.  Home Medications   Current Outpatient Rx  Name  Route  Sig  Dispense  Refill  . albuterol (PROVENTIL HFA;VENTOLIN HFA) 108 (90 BASE) MCG/ACT inhaler   Inhalation   Inhale 2 puffs into the lungs every 4 (four) hours as needed for wheezing.   2 Inhaler   0   . amLODipine (NORVASC) 10 MG tablet   Oral   Take 10 mg by mouth daily.           Marland Kitchen aspirin EC 81 MG tablet   Oral   Take 81 mg by mouth daily.         Marland Kitchen atorvastatin (LIPITOR) 10 MG tablet   Oral  Take 10 mg by mouth at bedtime.         . Cyanocobalamin (B-12 SUPER STRENGTH SL)   Sublingual   Place 1 tablet under the tongue daily.         . furosemide (LASIX) 20 MG tablet   Oral   Take 20 mg by mouth 2 (two) times daily.         Marland Kitchen gabapentin (NEURONTIN) 300 MG capsule   Oral   Take 300 mg by mouth 2 (two) times daily.          Marland Kitchen lisinopril (PRINIVIL,ZESTRIL) 10 MG tablet   Oral   Take 20 mg by mouth daily.         . metoprolol succinate (TOPROL-XL) 25 MG 24 hr tablet   Oral   Take 25 mg by mouth daily.         Marland Kitchen acetaminophen (TYLENOL) 500 MG tablet   Oral   Take 500-1,000 mg by mouth daily as needed for pain.          BP 135/79  Pulse 87  Temp(Src) 98.7 F (37.1 C) (Oral)  Resp 18  Ht 5\' 5"  (1.651 m)  Wt 305 lb  (138.347 kg)  BMI 50.75 kg/m2  SpO2 99% Physical Exam  Nursing note and vitals reviewed. Constitutional: She is oriented to person, place, and time. She appears well-developed.  Obese, appears older than stated age  HENT:  Head: Normocephalic and atraumatic.  Eyes: Conjunctivae and EOM are normal. Pupils are equal, round, and reactive to light.  Neck: Normal range of motion and phonation normal. Neck supple.  Cardiovascular: Normal rate, regular rhythm and intact distal pulses.   Pulmonary/Chest: Effort normal and breath sounds normal. No respiratory distress. She has no wheezes. She exhibits no tenderness.  Abdominal: Soft. She exhibits no distension. There is no tenderness. There is no guarding.  Musculoskeletal: Normal range of motion. She exhibits no edema and no tenderness.  No swelling of the lower extremities. No calf tenderness.  Neurological: She is alert and oriented to person, place, and time. She has normal strength. No cranial nerve deficit. She exhibits normal muscle tone. Coordination normal.  No ataxia. Romberg negative.   Skin: Skin is warm and dry.  Psychiatric: She has a normal mood and affect. Her behavior is normal. Judgment and thought content normal.    ED Course  Procedures (including critical care time)    Patient Vitals for the past 24 hrs:  BP Temp Temp src Pulse Resp SpO2 Height Weight  05/19/13 1910 135/79 mmHg - - 87 18 99 % - -  05/19/13 1841 112/74 mmHg 98.7 F (37.1 C) Oral 88 19 98 % 5\' 5"  (1.651 m) 305 lb (138.347 kg)      Labs Reviewed  CBC - Abnormal; Notable for the following:    Hemoglobin 11.7 (*)    All other components within normal limits  PRO B NATRIURETIC PEPTIDE - Abnormal; Notable for the following:    Pro B Natriuretic peptide (BNP) 187.4 (*)    All other components within normal limits  TROPONIN I     Date: 05/19/13  Rate: 88  Rhythm: normal sinus rhythm  QRS Axis: normal  PR and QT Intervals: normal  ST/T Wave  abnormalities: normal  PR and QRS Conduction Disutrbances:nonspecific intraventricular conduction delay  Narrative Interpretation:   Old EKG Reviewed: unchanged- 01/05/13     Dg Chest Port 1 View  05/19/2013   *RADIOLOGY REPORT*  Clinical Data: Chest pain,  left arm numbness.  PORTABLE CHEST - 1 VIEW  Comparison: 01/05/2013  Findings: Studies AP lordotic in positioning.  Heart is felt to be within normal limits in size.  Basilar markings are increased by the positioning.  No confluent opacities or effusions.  IMPRESSION: No acute cardiopulmonary disease.   Original Report Authenticated By: Rolm Baptise, M.D.   1. Chest pain, unspecified     MDM  Nonspecific chest pain, with history of diastolic congestive heart failure. No respiratory distress today. Doubt ACS, PE, or pneumonia. Nonspecific right facial abnormality is unlikely to represent TIA or CVA. Record review indicates last cardiac catheterization in 2003. Her chest pain is unlikely to be cardiac in origin, it be reasonable to evaluate this further, as an outpatient with a cardiac stress test.  Doubt metabolic instability, serious bacterial infection or impending vascular collapse; the patient is stable for discharge.  The findings were discussed with the patient, and her son. They agree with the plan of PCP followup to further evaluate her problems.  Nursing Notes Reviewed/ Care Coordinated, and agree without changes. Applicable Imaging Reviewed.  Interpretation of Laboratory Data incorporated into ED treatment   Plan: Home Medications- usual; Home Treatments and Observation- rest; return here if the recommended treatment, does not improve the symptoms; Recommended follow up- PCP, one week    Richarda Blade, MD 05/19/13 2043

## 2013-05-19 NOTE — ED Notes (Signed)
About 30 minutes ago patient states L chest tightness started and radiated down L arm.  This has subsided.  15 min. Ago began having numbness in R face.  No facial assymetry.  C/O bilateral eye pain starting this morning.

## 2013-07-07 ENCOUNTER — Ambulatory Visit: Payer: BC Managed Care – PPO | Admitting: Orthopedic Surgery

## 2013-07-16 ENCOUNTER — Ambulatory Visit (INDEPENDENT_AMBULATORY_CARE_PROVIDER_SITE_OTHER): Payer: BC Managed Care – PPO | Admitting: Orthopedic Surgery

## 2013-07-16 ENCOUNTER — Ambulatory Visit (INDEPENDENT_AMBULATORY_CARE_PROVIDER_SITE_OTHER): Payer: BC Managed Care – PPO

## 2013-07-16 ENCOUNTER — Encounter: Payer: Self-pay | Admitting: Orthopedic Surgery

## 2013-07-16 VITALS — BP 172/102 | Ht 65.5 in | Wt 299.0 lb

## 2013-07-16 DIAGNOSIS — M25562 Pain in left knee: Secondary | ICD-10-CM

## 2013-07-16 DIAGNOSIS — M25569 Pain in unspecified knee: Secondary | ICD-10-CM

## 2013-07-16 DIAGNOSIS — M170 Bilateral primary osteoarthritis of knee: Secondary | ICD-10-CM

## 2013-07-16 DIAGNOSIS — M1710 Unilateral primary osteoarthritis, unspecified knee: Secondary | ICD-10-CM

## 2013-07-16 DIAGNOSIS — M25561 Pain in right knee: Secondary | ICD-10-CM

## 2013-07-16 DIAGNOSIS — IMO0002 Reserved for concepts with insufficient information to code with codable children: Secondary | ICD-10-CM

## 2013-07-16 DIAGNOSIS — M48061 Spinal stenosis, lumbar region without neurogenic claudication: Secondary | ICD-10-CM | POA: Insufficient documentation

## 2013-07-16 MED ORDER — TRAMADOL-ACETAMINOPHEN 37.5-325 MG PO TABS: 1.0000 | ORAL_TABLET | ORAL | Status: DC | PRN

## 2013-07-16 NOTE — Patient Instructions (Addendum)
Therapy for back  ultracet for arthritis   Spinal Stenosis One cause of back pain is spinal stenosis. Stenosis means abnormal narrowing. The spinal canal contains and protects the spinal nerve roots. In spinal stenosis, the spinal canal narrows and pinches the spinal cord and nerves. This causes low back pain and pain in the legs. Stenosis may pinch the nerves that control muscles and sensation in the legs. This leads to pain and abnormal feelings in the leg muscles and areas supplied by those nerves. CAUSES  Spinal stenosis often happens to people as they get older and arthritic boney growths occur in their spinal canal. There is also a loss of the disk height between the bones of the back, which also adds to this problem. Sometimes the problem is present at birth. SYMPTOMS   Pain that is generally worse with activities, particularly standing and walking.  Numbness, tingling, hot or cold feelings, weakness, or a weariness in the legs.  Clumsiness, frequent falling, and a foot-slapping gait, which may come as a result of nerve pressure and muscle weakness. DIAGNOSIS   Your caregiver may suspect spinal stenosis if you have unusual leg symptoms, such as those previously mentioned.  Your orthopedic surgeon may request special imaging exams, such a computerized magnetic scan (MRI) or computerized X-ray scan (CT) to find out the cause of the problem. TREATMENT   Sometimes treatments such as postural changes or nonsteroidal anti-inflammatory drugs will relieve the pain.  Nonsteroidal anti-inflammatory medications may help relieve symptoms. These medicines do this by decreasing swelling and inflammation in the nerves.  When stenosis causes severe nerve root compression, conservative treatment may not be enough to maintain a normal lifestyle. Surgery may be recommended to relieve the pressure on affected nerves. In properly selected patients, the results are very good, and patients are able to  continue a normal lifestyle. HOME CARE INSTRUCTIONS   Flexing the spine by leaning forward while walking may relieve symptoms. Lying with the knees drawn up to the chest may offer some relief. These positions enlarge the space available to the nerves. They may make it easier for stenosis sufferers to walk longer distances.  Rest, followed by gradually resuming activity, also can help.  Aerobic activity, such as bicycling or swimming, is often recommended.  Losing weight can also relieve some of the load on the spine.  Application of warm or cold compresses to the area of pain can be helpful. SEEK MEDICAL CARE IF:   The periods of relief between episodes of pain become shorter and shorter.  You experience pain that radiates down your leg, even when you are not standing or walking. SEEK IMMEDIATE MEDICAL CARE IF:   You have a loss of bowel or bladder control.  You have a sudden loss of feeling in your legs.  You suddenly cannot move your legs. Document Released: 01/19/2004 Document Revised: 01/21/2012 Document Reviewed: 03/16/2010 [facility] Patient Information 2014 [place], [place].

## 2013-07-16 NOTE — Progress Notes (Signed)
Patient ID: Julie [name], female   DOB: 1956-11-07, 57 y.o.   MRN: [id number]  Chief Complaint  Patient presents with  . Knee Pain    Bilateral knee pain and giving away    HISTORY: This is a 57 year old female with chronic kidney disease currently in the mist of a workup for that presents with bilateral knee pain and giving way with no history of knee injury. She has a history of gout. She can be walking along her knees will give out. She does have a history of lumbar spine disease and pain no workup has been done for that. Her pain is described as throbbing sharp comes and goes occurs morning and night associated with numbness and tingling and locking  She reports grocery cart needed for shopping.  Review of systems weight gain blurred vision eye pain and ongoing chest pain shortness of breath wheezing cough snoring frequency urgency instability stiffness thirst seasonal allergies and the remaining systems were negative  The past, family history and social history have been reviewed and are recorded in the corresponding sections of epic   This is an obese female otherwise normal development grooming and hygiene without deformity BP 172/102  Ht 5' 5.5" (1.664 m)  Wt 299 lb (135.626 kg)  BMI 48.98 kg/m2 Her distal pulses are intact she has minimal edema no varicose veins. She walks without support. She has mild tenderness and crepitance in both knees but normal range of motion stability and strength  Skin both lower extremities normal  She's awake alert and oriented x3 mood and affect normal.  X-rays of her knee show mild arthritis  An old x-ray of her back she just lumbar disc disease  Her symptoms suggest spinal stenosis  Recommend physical therapy for spinal stenosis  For the arthritis which is mild recommend Ultracet no anti-inflammatory secondary to kidney disease no followup needed

## 2013-07-23 DIAGNOSIS — E559 Vitamin D deficiency, unspecified: Secondary | ICD-10-CM | POA: Insufficient documentation

## 2013-08-03 ENCOUNTER — Telehealth: Payer: Self-pay | Admitting: Orthopedic Surgery

## 2013-08-03 ENCOUNTER — Encounter: Payer: Self-pay | Admitting: Orthopedic Surgery

## 2013-08-03 NOTE — Telephone Encounter (Signed)
Work note is ready, patient aware.

## 2013-08-03 NOTE — Telephone Encounter (Signed)
Julie [name] was seen 07/16/13 for bilateral knee pain.  She has been going to therapy and says she cannot work due to her pain.  She is asking for an OOW note To begin 07/19/13 and go thru 08/16/13, to return 08/17/13. Please advise

## 2013-08-03 NOTE — Telephone Encounter (Signed)
Ok

## 2013-08-04 DIAGNOSIS — G4733 Obstructive sleep apnea (adult) (pediatric): Secondary | ICD-10-CM | POA: Insufficient documentation

## 2013-08-18 DIAGNOSIS — J309 Allergic rhinitis, unspecified: Secondary | ICD-10-CM | POA: Insufficient documentation

## 2013-08-18 DIAGNOSIS — D509 Iron deficiency anemia, unspecified: Secondary | ICD-10-CM | POA: Insufficient documentation

## 2013-09-14 ENCOUNTER — Emergency Department (HOSPITAL_COMMUNITY)
Admission: EM | Admit: 2013-09-14 | Discharge: 2013-09-14 | Disposition: A | Discharge status: Home / Self Care | Payer: BC Managed Care – PPO | Attending: Emergency Medicine | Admitting: Emergency Medicine

## 2013-09-14 DIAGNOSIS — L0231 Cutaneous abscess of buttock: Secondary | ICD-10-CM | POA: Insufficient documentation

## 2013-09-14 DIAGNOSIS — Z79899 Other long term (current) drug therapy: Secondary | ICD-10-CM | POA: Insufficient documentation

## 2013-09-14 DIAGNOSIS — Z8739 Personal history of other diseases of the musculoskeletal system and connective tissue: Secondary | ICD-10-CM | POA: Insufficient documentation

## 2013-09-14 DIAGNOSIS — Z7982 Long term (current) use of aspirin: Secondary | ICD-10-CM | POA: Insufficient documentation

## 2013-09-14 DIAGNOSIS — Z87448 Personal history of other diseases of urinary system: Secondary | ICD-10-CM | POA: Insufficient documentation

## 2013-09-14 DIAGNOSIS — Z87891 Personal history of nicotine dependence: Secondary | ICD-10-CM | POA: Insufficient documentation

## 2013-09-14 DIAGNOSIS — J45909 Unspecified asthma, uncomplicated: Secondary | ICD-10-CM | POA: Insufficient documentation

## 2013-09-14 DIAGNOSIS — I10 Essential (primary) hypertension: Secondary | ICD-10-CM | POA: Insufficient documentation

## 2013-09-14 DIAGNOSIS — Z8669 Personal history of other diseases of the nervous system and sense organs: Secondary | ICD-10-CM | POA: Insufficient documentation

## 2013-09-14 DIAGNOSIS — I509 Heart failure, unspecified: Secondary | ICD-10-CM | POA: Insufficient documentation

## 2013-09-14 DIAGNOSIS — M109 Gout, unspecified: Secondary | ICD-10-CM | POA: Insufficient documentation

## 2013-09-14 DIAGNOSIS — B49 Unspecified mycosis: Secondary | ICD-10-CM | POA: Insufficient documentation

## 2013-09-14 MED ORDER — CLOTRIMAZOLE 1 % EX CREA: TOPICAL_CREAM | CUTANEOUS | Status: DC

## 2013-09-14 MED ORDER — SULFAMETHOXAZOLE-TRIMETHOPRIM 800-160 MG PO TABS: 1.0000 | ORAL_TABLET | Freq: Two times a day (BID) | ORAL | Status: DC

## 2013-09-14 NOTE — ED Notes (Signed)
Pt has had similar incident in past; sought medical attention and was told it was a fungus/ was given a prescription and it went away but keeps coming back; usually doesn't appear like it has now, seems to have grown to the point it is at overnight as stated by pt.

## 2013-09-14 NOTE — ED Provider Notes (Signed)
Medical screening examination/treatment/procedure(s) were performed by non-physician practitioner and as supervising physician I was immediately available for consultation/collaboration.  EKG Interpretation   None         Wandra Arthurs, MD 09/14/13 2243

## 2013-09-14 NOTE — ED Provider Notes (Signed)
CSN: [id number]     Arrival date & time 09/14/13  1534 History  This chart was scribed for non-physician practitioner, Quincy Carnes, PA-C working with Wandra Arthurs, MD by Frederich Balding, ED scribe. This patient was seen in room TR09C/TR09C and the patient's care was started at 5:32 PM.   Chief Complaint  Patient presents with  . Rash   The history is provided by the patient. No language interpreter was used.   HPI Comments: Julie [name] is a 57 y.o. female who presents to the Emergency Department complaining of sudden onset rash to her right buttock.  She states it looks like small blisters and is painful but usually only itches. Sitting upright on her buttocks worsens the pain. Pt states she has been [facility] by the [facility] recently and was told it was a fungus and given medication for it with relief. She states the rash came on more suddenly this time and she was concerned. Pt denies any drainage, fevers, sweats, or chills.  No medications tried PTA.  Past Medical History  Diagnosis Date  . Hypertension   . Renal disorder   . Neuropathy   . Gout   . Plantar fasciitis   . CHF (congestive heart failure)   . Asthma    Past Surgical History  Procedure Laterality Date  . Abdominal hysterectomy     Family History  Problem Relation Age of Onset  . Stroke Mother    History  Substance Use Topics  . Smoking status: Former Smoker -- 1.00 packs/day for 38 years    Types: Cigarettes    Quit date: 08/18/2004  . Smokeless tobacco: Never Used  . Alcohol Use: No   OB History   Grav Para Term Preterm Abortions TAB SAB Ect Mult Living   6 4 4  2  2   4      Review of Systems  Skin: Positive for rash.  All other systems reviewed and are negative.    Allergies  Review of patient's allergies indicates no known allergies.  Home Medications   Current Outpatient Rx  Name  Route  Sig  Dispense  Refill  . acetaminophen (TYLENOL) 500 MG tablet   Oral   Take 500-1,000 mg by mouth  daily as needed for pain.         Marland Kitchen albuterol (PROVENTIL HFA;VENTOLIN HFA) 108 (90 BASE) MCG/ACT inhaler   Inhalation   Inhale 2 puffs into the lungs every 4 (four) hours as needed for wheezing.   2 Inhaler   0   . amLODipine (NORVASC) 10 MG tablet   Oral   Take 10 mg by mouth daily.           Marland Kitchen aspirin EC 81 MG tablet   Oral   Take 81 mg by mouth daily.         Marland Kitchen atorvastatin (LIPITOR) 10 MG tablet   Oral   Take 10 mg by mouth at bedtime.         . Cyanocobalamin (B-12 SUPER STRENGTH SL)   Sublingual   Place 1 tablet under the tongue daily.         . furosemide (LASIX) 20 MG tablet   Oral   Take 20 mg by mouth 2 (two) times daily.         Marland Kitchen gabapentin (NEURONTIN) 300 MG capsule   Oral   Take 300 mg by mouth 2 (two) times daily.          Marland Kitchen  lisinopril (PRINIVIL,ZESTRIL) 10 MG tablet   Oral   Take 20 mg by mouth daily.         . metoprolol succinate (TOPROL-XL) 25 MG 24 hr tablet   Oral   Take 25 mg by mouth daily.         . traMADol-acetaminophen (ULTRACET) 37.5-325 MG per tablet   Oral   Take 1 tablet by mouth every 4 (four) hours as needed for pain.   90 tablet   5    BP 148/90  Pulse 92  Temp(Src) 98.7 F (37.1 C) (Oral)  Resp 20  Wt 297 lb 6 oz (134.888 kg)  SpO2 96%  Physical Exam  Nursing note and vitals reviewed. Constitutional: She is oriented to person, place, and time. She appears well-developed and well-nourished.  HENT:  Head: Normocephalic and atraumatic.  Mouth/Throat: Oropharynx is clear and moist.  Eyes: Conjunctivae and EOM are normal. Pupils are equal, round, and reactive to light.  Neck: Normal range of motion.  Cardiovascular: Normal rate, regular rhythm and normal heart sounds.   Pulmonary/Chest: Effort normal and breath sounds normal.  Abdominal: Soft. Bowel sounds are normal.  Musculoskeletal: Normal range of motion.  Neurological: She is alert and oriented to person, place, and time.  Skin: Skin is warm and  dry.  Small area of fungal rash to right buttock with underlying abscess formation. Tender to palpation with central fluctuance. Localized erythema without induration or signs of cellulitis.  No vesicles or crusting present  Psychiatric: She has a normal mood and affect.    ED Course  Procedures (including critical care time)  INCISION AND DRAINAGE Performed by: Larene Pickett Consent: Verbal consent obtained. Risks and benefits: risks, benefits and alternatives were discussed Type: abscess  Body area: right buttock  Anesthesia: local infiltration  Incision was made with a scalpel.  Local anesthetic: lidocaine 1% without epinephrine  Anesthetic total: 3 ml  Complexity: complex Blunt dissection to break up loculations  Drainage: purulent  Drainage amount: small  Packing material: none  Patient tolerance: Patient tolerated the procedure well with no immediate complications.   DIAGNOSTIC STUDIES: Oxygen Saturation is 96% on RA, normal by my interpretation.    COORDINATION OF CARE: 5:35 PM-Discussed treatment plan which includes I&D and an antibiotic with pt at bedside and pt agreed to plan.   Labs Review Labs Reviewed - No data to display Imaging Review No results found.  EKG Interpretation   None       MDM   1. Abscess of buttock, right   2. Fungal infection    I&D as above with small amount of purulent drainage- pt tolerated well.  Given location and recurrent nature, will start on course abx and lotrimin cream.  Encouraged to FU with [facility] if problems occur.  Discussed plan with pt, she agreed.  Return precautions advised.  I personally performed the services described in this documentation, which was scribed in my presence. The recorded information has been reviewed and is accurate.   Larene Pickett, PA-C 09/14/13 2003

## 2013-09-14 NOTE — ED Notes (Signed)
PT with non-pruritic, painful rash to R buttock that comes and goes.  She has been seen at the health department and they have told her it is a fungus.  Fungal meds have worked.  Pt feels this rash "flared up" a lot faster.

## 2013-09-28 DIAGNOSIS — D472 Monoclonal gammopathy: Secondary | ICD-10-CM | POA: Insufficient documentation

## 2013-11-02 DIAGNOSIS — Z0289 Encounter for other administrative examinations: Secondary | ICD-10-CM

## 2014-01-26 ENCOUNTER — Other Ambulatory Visit: Payer: Self-pay | Admitting: *Deleted

## 2014-01-26 DIAGNOSIS — M48061 Spinal stenosis, lumbar region without neurogenic claudication: Secondary | ICD-10-CM

## 2014-01-26 DIAGNOSIS — M25562 Pain in left knee: Secondary | ICD-10-CM

## 2014-01-26 DIAGNOSIS — M25561 Pain in right knee: Secondary | ICD-10-CM

## 2014-01-26 MED ORDER — TRAMADOL-ACETAMINOPHEN 37.5-325 MG PO TABS: 1.0000 | ORAL_TABLET | ORAL | Status: DC | PRN

## 2014-03-12 DIAGNOSIS — K449 Diaphragmatic hernia without obstruction or gangrene: Secondary | ICD-10-CM | POA: Insufficient documentation

## 2014-04-12 DIAGNOSIS — R7689 Other specified abnormal immunological findings in serum: Secondary | ICD-10-CM | POA: Insufficient documentation

## 2014-04-28 DIAGNOSIS — R12 Heartburn: Secondary | ICD-10-CM | POA: Insufficient documentation

## 2014-06-01 ENCOUNTER — Ambulatory Visit (HOSPITAL_COMMUNITY)
Admission: RE | Admit: 2014-06-01 | Discharge: 2014-06-01 | Disposition: A | Discharge status: Home / Self Care | Payer: Medicaid Other | Source: Ambulatory Visit | Attending: Orthopedic Surgery | Admitting: Orthopedic Surgery

## 2014-06-01 DIAGNOSIS — M539 Dorsopathy, unspecified: Secondary | ICD-10-CM | POA: Diagnosis not present

## 2014-06-01 DIAGNOSIS — M545 Low back pain, unspecified: Secondary | ICD-10-CM | POA: Diagnosis not present

## 2014-06-01 DIAGNOSIS — M25659 Stiffness of unspecified hip, not elsewhere classified: Secondary | ICD-10-CM

## 2014-06-01 DIAGNOSIS — R269 Unspecified abnormalities of gait and mobility: Secondary | ICD-10-CM | POA: Insufficient documentation

## 2014-06-01 DIAGNOSIS — IMO0001 Reserved for inherently not codable concepts without codable children: Secondary | ICD-10-CM | POA: Insufficient documentation

## 2014-06-01 DIAGNOSIS — M6281 Muscle weakness (generalized): Secondary | ICD-10-CM | POA: Insufficient documentation

## 2014-06-01 DIAGNOSIS — M5442 Lumbago with sciatica, left side: Secondary | ICD-10-CM

## 2014-06-01 DIAGNOSIS — M5441 Lumbago with sciatica, right side: Secondary | ICD-10-CM

## 2014-06-01 NOTE — Evaluation (Signed)
Physical Therapy Evaluation  Patient Details  Name: Julie [name] MRN: [id number] Date of Birth: 11-28-1955  Today's Date: 06/01/2014 Time: 0805-0845 PT Time Calculation (min): 40 min     Charges: 1 Evaluation, Gait Training [id number]         Visit#: 1 of 18  Re-eval: 07/01/14 Assessment Diagnosis: Lumbago secondary to limited stiffness in low back, and hips.  Next MD Visit: Debera Lat, unsure when Prior Therapy: yes, stopped secondary to cancer  Authorization: BCBS    Authorization Time Period:    Authorization Visit#:   of     Past Medical History:  Past Medical History  Diagnosis Date  . Hypertension   . Renal disorder   . Neuropathy   . Gout   . Plantar fasciitis   . CHF (congestive heart failure)   . Asthma    Past Surgical History:  Past Surgical History  Procedure Laterality Date  . Abdominal hysterectomy      Subjective Symptoms/Limitations Symptoms: "my low back feels like is is about to break. numbness and tingling in feet Pertinent History: Patient has had a long history of back pain, and recently saw MD with complain on knees giving way for which uponm further examination MD determined weakness was secondary to lumbar spinal stenosis. HBP, DM2, recent histor of chest pain, Numbness and tingling in feet, worsened over the last 2 weeks. Kidney failure.  difficulty sleeping secondary to overactive bladder.  history of cancer, patient is unsure where.  Limitations: Standing How long can you stand comfortably?: <1/2 hour How long can you walk comfortably?: <87minutes Patient Stated Goals: Less pain and to be able to stand/walk longer.  Pain Assessment Currently in Pain?: Yes Pain Score: 4  Pain Location: Back Pain Orientation: Lower;Medial;Lateral Pain Type: Chronic pain Pain Radiating Towards: down both legs Lt more problematic than Rt Pain Onset: More than a month ago Pain Frequency: Constant Pain Relieving Factors: sitting, trimadol,  Effect  of Pain on Daily Activities: difficulty walking, standing, sleeping.   Cognition/Observation Observation/Other Assessments Observations: Gait: limited hip internal rotation, excessive toe out, pes planus, excessive arch collapse of bilateral feet.  Other Assessments: Posture:   Assessment LLE AROM (degrees) Left Hip External Rotation : 45 Left Hip Internal Rotation : 15 Left Ankle Dorsiflexion: -3 LLE Strength Left Hip Flexion: 3+/5 Left Hip Extension: 2+/5 Left Hip ABduction: 3+/5 Left Knee Flexion:  (4-/5) Left Knee Extension:  (4-/5) Left Ankle Dorsiflexion: 4/5 Lumbar AROM Lumbar Flexion: 56 degrees Lumbar Extension: 24 degrees (worse pain/stiffness) Lumbar - Right Rotation: 38 Lumbar - Left Rotation: 35 Lumbar Strength Lumbar Flexion: 3+/5 Lumbar Extension: 3+/5  Exercise/Treatments Mobility/Balance  Ambulation/Gait Ambulation/Gait: Yes Ambulation/Gait Assistance: 7: Independent Ambulation Distance (Feet): 50 Feet Assistive device: Straight cane Gait velocity: patient ambulates with cain on wrong side and not properly fitted, following re fittign and switching to correct hand patient displays improved gait.    Physical Therapy Assessment and Plan PT Assessment and Plan Clinical Impression Statement: patient displays signs and symptoms consistent with lumbar spinal stenosis inluding, lumbar spine and hip stiffness and weakness in trunk flexors and extrensors resulting in limited stability requried for walking, and radiating symptims into bilateral LE.Marland Kitchen Patient initially demosntrated gait ambualting with a SPC that was too long and on her "weaker side." Following gait training with education on proper cain use and fitting correct height to patient, patient displayed improved gait. Specific limitations found during assessmnent include: bilateral Limited glut max, piriformis , hamstring, and hip flexor mobility, as  well as decreased strength in said muscles as well as weak  abdominal muscl contractions resulting in limited trunk stability.  Patient will benefit from skilled phsycial  therapy to increase LE mobiliyt and strength to decrease stran on lumbar spine when patient performs standing activities.  Pt will benefit from skilled therapeutic intervention in order to improve on the following deficits: Decreased activity tolerance;Decreased strength;Difficulty walking;Impaired sensation;Pain;Obesity;Improper spinal/pelvic alignment;Improper body mechanics;Increased edema;Increased fascial restricitons;Increased muscle spasms;Impaired flexibility;Abnormal gait;Decreased balance Rehab Potential: Fair Clinical Impairments Affecting Rehab Potential: Long history of low back pain and assortment of other chornic health issues.  PT Frequency: Min 3X/week PT Duration: 6 weeks PT Treatment/Interventions: Gait training;Stair training;Functional mobility training;Therapeutic activities;Therapeutic exercise;Balance training;Modalities;Manual techniques;Patient/family education PT Plan: nex session to fous on education and adaptation of exercises for patient to perform as part of HEP including: hamstring glut, piriformis, hip flexor, groin, and gastroc stretches, patient will fill out FOTO (did not perform durign initial session secondry to limited time. As patient's mobility improved focus to shift towards strengthening LE and Core muslces t decrease strain on low back muscles.     Goals Home Exercise Program Pt/caregiver will Perform Home Exercise Program: For increased ROM;For increased strengthening PT Goal: Perform Home Exercise Program - Progress: Goal set today PT Short Term Goals Time to Complete Short Term Goals: 3 weeks PT Short Term Goal 1: Increase liriformis muscle mobility so patient has no paoin ith stretch and  bilateral hip internal rotation to improve to  > 30 degrees to improve shock absorbtion during gait PT Short Term Goal 2: Increase ankle dorsiflexion to 15  degrees to decrease early heel rise during gait PT Short Term Goal 3: hamstring mobility to 80 degrees with Straight leg raise to decrease strain on low back with prolonged standng.  PT Short Term Goal 4: Patient to be bale to walk greater than 1 hour with pain <3/10 to be able to perform most errands PT Long Term Goals PT Long Term Goal 1: Patient to be bale to walk greater than 2 hour with no pain to be able to perform most errands PT Long Term Goal 2: patient will demosntrated increased trunk flexion/extension strength of 4/5 to indicate increased trunk stability.  Long Term Goal 3: Patient will demosntrate glut med/max strength of 4/5 indicatign improved LE strength to more easily stand from chair withtou UE use.   Problem List Patient Active Problem List   Diagnosis Date Noted  . Stiffness of joint, not elsewhere classified, pelvic region and thigh 06/01/2014  . Lumbago 06/01/2014  . Muscle weakness (generalized) 06/01/2014  . Abnormality of gait 06/01/2014  . Spinal stenosis of lumbar region 07/16/2013  . LBBB (left bundle branch block) 01/15/2013  . HTN (hypertension) 01/15/2013  . Diastolic CHF, chronic 0000000    PT - End of Session Activity Tolerance: Patient tolerated treatment well;Patient limited by fatigue General Behavior During Therapy: WFL for tasks assessed/performed PT Plan of Care PT Home Exercise Plan: to be given next session as patient demosntrated initial difficulty with correct performance.   GP    Julie [name] 06/01/2014, 9:15 AM  Physician Documentation Your signature is required to indicate approval of the treatment plan as stated above.  Please sign and either send electronically or make a copy of this report for your files and return this physician signed original.   Please mark one 1.__approve of plan  2. ___approve of plan with the following conditions.   ______________________________  _____________________ Physician Signature                                                                                                             Date

## 2014-06-04 ENCOUNTER — Ambulatory Visit (HOSPITAL_COMMUNITY): Payer: Medicaid Other | Admitting: Physical Therapy

## 2014-06-07 ENCOUNTER — Ambulatory Visit (HOSPITAL_COMMUNITY)
Admission: RE | Admit: 2014-06-07 | Discharge: 2014-06-07 | Disposition: A | Discharge status: Home / Self Care | Payer: Medicaid Other | Source: Ambulatory Visit | Attending: *Deleted | Admitting: *Deleted

## 2014-06-07 DIAGNOSIS — IMO0001 Reserved for inherently not codable concepts without codable children: Secondary | ICD-10-CM | POA: Diagnosis not present

## 2014-06-07 NOTE — Progress Notes (Signed)
Physical Therapy Treatment Patient Details  Name: Julie [name] MRN: [id number] Date of Birth: 10-30-1956  Today's Date: 06/07/2014 Time: [id number] PT Time Calculation (min): 35 min Charge: TE [id number]   Visit#: 2 of 18  Re-eval: 07/01/14    Authorization: BCBS  Authorization Time Period:    Authorization Visit#:   of     Subjective: Symptoms/Limitations Symptoms: Lower back pain scale 4/10 today with radicular symptoms down Lt LE.   Pain Assessment Currently in Pain?: Yes Pain Score: 4  Pain Location: Back Pain Orientation: Lower Pain Radiating Towards: Lt LE   Objective:  Exercise/Treatments Stretches Active Hamstring Stretch: 3 reps;30 seconds;Limitations Active Hamstring Stretch Limitations: 10in step 3way direction Hip Flexor Stretch: 2 reps;20 seconds;Limitations Hip Flexor Stretch Limitations: 10in step Piriformis Stretch: 3 reps;30 seconds;Limitations Piriformis Stretch Limitations: seated and supine with therapist assistance Standing Other Standing Lumbar Exercises: Gastroc stretch 3x 30"     Physical Therapy Assessment and Plan PT Assessment and Plan Clinical Impression Statement: Session focus on improving LE flexibilty/mobilty.  Pt able to follow demonstration with min cueing for form and technique.  Pt given HEP with new stretches and able to verbalize form and appropriate hold for stretches.  FOTO complete with 33% status,  67%limitations.   PT Plan: Continue with current POC with stretches to improve LE mobiility, Begin 3D hip excursion next session.  As patient's mobility improves focus to shift towards strengtehning LE and core musculature to decrease strain on lower back muscles.      Goals PT Short Term Goals PT Short Term Goal 1: Increase piiriformis muscle mobility so patient has no paoin ith stretch and  bilateral hip internal rotation to improve to  > 30 degrees to improve shock absorbtion during gait PT Short Term Goal 1 - Progress:  Progressing toward goal PT Short Term Goal 2: Increase ankle dorsiflexion to 15 degrees to decrease early heel rise during gait PT Short Term Goal 2 - Progress: Progressing toward goal PT Short Term Goal 3: hamstring mobility to 80 degrees with Straight leg raise to decrease strain on low back with prolonged standng.  PT Short Term Goal 3 - Progress: Progressing toward goal PT Short Term Goal 4: Patient to be bale to walk greater than 1 hour with pain <3/10 to be able to perform most errands PT Short Term Goal 4 - Progress: Progressing toward goal PT Long Term Goals PT Long Term Goal 1: Patient to be bale to walk greater than 2 hour with no pain to be able to perform most errands PT Long Term Goal 2: patient will demosntrated increased trunk flexion/extension strength of 4/5 to indicate increased trunk stability.  Long Term Goal 3: Patient will demosntrate glut med/max strength of 4/5 indicatign improved LE strength to more easily stand from chair withtou UE use.   Problem List Patient Active Problem List   Diagnosis Date Noted  . Stiffness of joint, not elsewhere classified, pelvic region and thigh 06/01/2014  . Lumbago 06/01/2014  . Muscle weakness (generalized) 06/01/2014  . Abnormality of gait 06/01/2014  . Spinal stenosis of lumbar region 07/16/2013  . LBBB (left bundle branch block) 01/15/2013  . HTN (hypertension) 01/15/2013  . Diastolic CHF, chronic 0000000    PT - End of Session Activity Tolerance: Patient tolerated treatment well General Behavior During Therapy: [facility] for tasks assessed/performed PT Plan of Care PT Home Exercise Plan: Stretches worksheet given today  GP    Aldona Lento 06/07/2014, 3:46 PM

## 2014-06-09 ENCOUNTER — Telehealth (HOSPITAL_COMMUNITY): Payer: Self-pay

## 2014-06-09 ENCOUNTER — Ambulatory Visit (HOSPITAL_COMMUNITY)
Admission: RE | Admit: 2014-06-09 | Payer: Medicaid Other | Source: Ambulatory Visit | Attending: *Deleted | Admitting: *Deleted

## 2014-06-11 ENCOUNTER — Ambulatory Visit (HOSPITAL_COMMUNITY)
Admission: RE | Admit: 2014-06-11 | Discharge: 2014-06-11 | Disposition: A | Discharge status: Home / Self Care | Payer: Medicaid Other | Source: Ambulatory Visit | Attending: *Deleted | Admitting: *Deleted

## 2014-06-11 DIAGNOSIS — IMO0001 Reserved for inherently not codable concepts without codable children: Secondary | ICD-10-CM | POA: Diagnosis not present

## 2014-06-11 NOTE — Progress Notes (Signed)
Physical Therapy Re-evaluation/Treatment note/ Discharge summary  Patient Details  Name: Julie [name] MRN: [id number] Date of Birth: 1956/03/30  Today's Date: 06/11/2014 Time: 1030-1108 PT Time Calculation (min): 38 min Charge: TE 1030-1055, MMT/ROM 1055-1105, FOTO no charge              Visit#: 3 of 18  Re-eval: 07/01/14 Assessment Diagnosis: Lumbago secondary to limited stiffness in low back, and hips.  Next MD Visit: Debera Lat, unsure when Prior Therapy: yes, stopped secondary to cancer  Authorization: BCBS now Medicaid    Authorization Time Period:    Authorization Visit#:   of     Subjective Symptoms/Limitations Symptoms: Lower back pain 4/10 in center of back.  Pt reported change in insurance last month from [facility] to [place]. How long can you stand comfortably?: 20-30 minutes (<1/2 hour) How long can you walk comfortably?: 20-30 minutes (<58mnutes) Pain Assessment Currently in Pain?: Yes Pain Score: 4  Pain Location: Back Pain Orientation: Lower  Objective:   Sensation/Coordination/Flexibility/Functional Tests Functional Tests Functional Tests: FOTO limitation 51% (FOTO was limitation 67%)  Assessment LLE AROM (degrees) Left Hip External Rotation : 45 Left Hip Internal Rotation : 20 (was 15) Left Ankle Dorsiflexion: 12 (was -3) LLE Strength Left Hip Flexion: 3+/5 (was 3+/5) Left Hip Extension: 3-/5 (was 2+/5) Left Hip ABduction: 3+/5 (was 3+/5) Left Knee Flexion:  (4-/5 was 4-/5) Left Knee Extension: 4/5 Left Ankle Dorsiflexion: 4/5 Lumbar AROM Lumbar Flexion: 60 Lumbar Extension: 30  Lumbar Strength Lumbar Flexion: 3+/5 Lumbar Extension: 3+/5  Exercise/Treatments Stretches Active Hamstring Stretch: 3 reps;30 seconds;Limitations Active Hamstring Stretch Limitations: 10in step 3way direction Hip Flexor Stretch: 1 rep;30 seconds;Limitations Hip Flexor Stretch Limitations: 10in step Bil Piriformis Stretch: 3 reps;30  seconds;Limitations Piriformis Stretch Limitations: seated and supine with therapist assistance Standing Other Standing Lumbar Exercises: Gastroc stretch 3x 30" Other Standing Lumbar Exercises: 3D hip excursion     Physical Therapy Assessment and Plan PT Assessment and Plan Clinical Impression Statement: Re-eval complete following report of change in insurance to Medicaid.  Pt reports complaince somewhat with HEP, pt was able to demosntrate appropriate form wtih HEP stretches.  Pt overall improved AROM, strength same as initial eval through just 3rd sessoin  Began 3D hip excursion to improve hip mobilty and gluteal strengtheing, pt able to demonstrate appropriate technique with new exercise and stretches.  Pt given HEP printout.  Pt with increased perceived functional abiltiies with FOTO score of 49% stated (was 33% initially). PT Plan: D/C to HEP/    Goals PT Short Term Goals PT Short Term Goal 1: Increase piiriformis muscle mobility so patient has no paoin ith stretch and  bilateral hip internal rotation to improve to  > 30 degrees to improve shock absorbtion during gait PT Short Term Goal 1 - Progress: Progressing toward goal PT Short Term Goal 2: Increase ankle dorsiflexion to 15 degrees to decrease early heel rise during gait PT Short Term Goal 2 - Progress: Progressing toward goal PT Short Term Goal 3: hamstring mobility to 80 degrees with Straight leg raise to decrease strain on low back with prolonged standng.  PT Short Term Goal 3 - Progress: Progressing toward goal PT Short Term Goal 4: Patient to be bale to walk greater than 1 hour with pain <3/10 to be able to perform most errands PT Short Term Goal 4 - Progress: Progressing toward goal PT Long Term Goals PT Long Term Goal 1: Patient to be bale to walk greater than 2 hour with no  pain to be able to perform most errands PT Long Term Goal 1 - Progress: Not met PT Long Term Goal 2: patient will demosntrated increased trunk  flexion/extension strength of 4/5 to indicate increased trunk stability.  PT Long Term Goal 2 - Progress: Progressing toward goal Long Term Goal 3: Patient will demosntrate glut med/max strength of 4/5 indicatign improved LE strength to more easily stand from chair withtou UE use.  Long Term Goal 3 Progress: Progressing toward goal  Problem List Patient Active Problem List   Diagnosis Date Noted  . Stiffness of joint, not elsewhere classified, pelvic region and thigh 06/01/2014  . Lumbago 06/01/2014  . Muscle weakness (generalized) 06/01/2014  . Abnormality of gait 06/01/2014  . Spinal stenosis of lumbar region 07/16/2013  . LBBB (left bundle branch block) 01/15/2013  . HTN (hypertension) 01/15/2013  . Diastolic CHF, chronic 48/34/7583    PT - End of Session Activity Tolerance: Patient tolerated treatment well General Behavior During Therapy: WFL for tasks assessed/performed PT Plan of Care PT Home Exercise Plan: LE stretches and 3D hip excursion  GP    Aldona Lento 06/11/2014, 12:30 PM  Devona Konig  Physician Documentation Your signature is required to indicate approval of the treatment plan as stated above.  Please sign and either send electronically or make a copy of this report for your files and return this physician signed original.   Please mark one 1.__approve of plan  2. ___approve of plan with the following conditions.   ______________________________                                                          _____________________ Physician Signature                                                                                                             Date

## 2014-06-11 NOTE — Progress Notes (Signed)
Physical Therapy Re-evaluation/Treatment note/ Discharge summary  Patient Details  Name: Julie [name] MRN: [id number] Date of Birth: 1956/03/30  Today's Date: 06/11/2014 Time: 1030-1108 PT Time Calculation (min): 38 min Charge: TE 1030-1055, MMT/ROM 1055-1105, FOTO no charge              Visit#: 3 of 18  Re-eval: 07/01/14 Assessment Diagnosis: Lumbago secondary to limited stiffness in low back, and hips.  Next MD Visit: Debera Lat, unsure when Prior Therapy: yes, stopped secondary to cancer  Authorization: BCBS now Medicaid    Authorization Time Period:    Authorization Visit#:   of     Subjective Symptoms/Limitations Symptoms: Lower back pain 4/10 in center of back.  Pt reported change in insurance last month from [facility] to [place]. How long can you stand comfortably?: 20-30 minutes (<1/2 hour) How long can you walk comfortably?: 20-30 minutes (<58mnutes) Pain Assessment Currently in Pain?: Yes Pain Score: 4  Pain Location: Back Pain Orientation: Lower  Objective:   Sensation/Coordination/Flexibility/Functional Tests Functional Tests Functional Tests: FOTO limitation 51% (FOTO was limitation 67%)  Assessment LLE AROM (degrees) Left Hip External Rotation : 45 Left Hip Internal Rotation : 20 (was 15) Left Ankle Dorsiflexion: 12 (was -3) LLE Strength Left Hip Flexion: 3+/5 (was 3+/5) Left Hip Extension: 3-/5 (was 2+/5) Left Hip ABduction: 3+/5 (was 3+/5) Left Knee Flexion:  (4-/5 was 4-/5) Left Knee Extension: 4/5 Left Ankle Dorsiflexion: 4/5 Lumbar AROM Lumbar Flexion: 60 Lumbar Extension: 30  Lumbar Strength Lumbar Flexion: 3+/5 Lumbar Extension: 3+/5  Exercise/Treatments Stretches Active Hamstring Stretch: 3 reps;30 seconds;Limitations Active Hamstring Stretch Limitations: 10in step 3way direction Hip Flexor Stretch: 1 rep;30 seconds;Limitations Hip Flexor Stretch Limitations: 10in step Bil Piriformis Stretch: 3 reps;30  seconds;Limitations Piriformis Stretch Limitations: seated and supine with therapist assistance Standing Other Standing Lumbar Exercises: Gastroc stretch 3x 30" Other Standing Lumbar Exercises: 3D hip excursion     Physical Therapy Assessment and Plan PT Assessment and Plan Clinical Impression Statement: Re-eval complete following report of change in insurance to Medicaid.  Pt reports complaince somewhat with HEP, pt was able to demosntrate appropriate form wtih HEP stretches.  Pt overall improved AROM, strength same as initial eval through just 3rd sessoin  Began 3D hip excursion to improve hip mobilty and gluteal strengtheing, pt able to demonstrate appropriate technique with new exercise and stretches.  Pt given HEP printout.  Pt with increased perceived functional abiltiies with FOTO score of 49% stated (was 33% initially). PT Plan: D/C to HEP/    Goals PT Short Term Goals PT Short Term Goal 1: Increase piiriformis muscle mobility so patient has no paoin ith stretch and  bilateral hip internal rotation to improve to  > 30 degrees to improve shock absorbtion during gait PT Short Term Goal 1 - Progress: Progressing toward goal PT Short Term Goal 2: Increase ankle dorsiflexion to 15 degrees to decrease early heel rise during gait PT Short Term Goal 2 - Progress: Progressing toward goal PT Short Term Goal 3: hamstring mobility to 80 degrees with Straight leg raise to decrease strain on low back with prolonged standng.  PT Short Term Goal 3 - Progress: Progressing toward goal PT Short Term Goal 4: Patient to be bale to walk greater than 1 hour with pain <3/10 to be able to perform most errands PT Short Term Goal 4 - Progress: Progressing toward goal PT Long Term Goals PT Long Term Goal 1: Patient to be bale to walk greater than 2 hour with no  pain to be able to perform most errands PT Long Term Goal 1 - Progress: Not met PT Long Term Goal 2: patient will demosntrated increased trunk  flexion/extension strength of 4/5 to indicate increased trunk stability.  PT Long Term Goal 2 - Progress: Progressing toward goal Long Term Goal 3: Patient will demosntrate glut med/max strength of 4/5 indicatign improved LE strength to more easily stand from chair withtou UE use.  Long Term Goal 3 Progress: Progressing toward goal  Problem List Patient Active Problem List   Diagnosis Date Noted  . Stiffness of joint, not elsewhere classified, pelvic region and thigh 06/01/2014  . Lumbago 06/01/2014  . Muscle weakness (generalized) 06/01/2014  . Abnormality of gait 06/01/2014  . Spinal stenosis of lumbar region 07/16/2013  . LBBB (left bundle branch block) 01/15/2013  . HTN (hypertension) 01/15/2013  . Diastolic CHF, chronic 94/71/2527    PT - End of Session Activity Tolerance: Patient tolerated treatment well General Behavior During Therapy: WFL for tasks assessed/performed PT Plan of Care PT Home Exercise Plan: LE stretches and 3D hip excursion  GP    Aldona Lento 06/11/2014, 12:30 PM  Physician Documentation Your signature is required to indicate approval of the treatment plan as stated above.  Please sign and either send electronically or make a copy of this report for your files and return this physician signed original.   Please mark one 1.__approve of plan  2. ___approve of plan with the following conditions.   ______________________________                                                          _____________________ Physician Signature                                                                                                             Date

## 2014-06-14 ENCOUNTER — Ambulatory Visit (HOSPITAL_COMMUNITY): Payer: Medicaid Other

## 2014-06-15 ENCOUNTER — Ambulatory Visit (HOSPITAL_COMMUNITY)
Admission: RE | Admit: 2014-06-15 | Payer: Medicaid Other | Source: Ambulatory Visit | Attending: *Deleted | Admitting: *Deleted

## 2014-06-16 ENCOUNTER — Ambulatory Visit (HOSPITAL_COMMUNITY): Payer: Medicaid Other | Admitting: Physical Therapy

## 2014-06-22 ENCOUNTER — Ambulatory Visit (HOSPITAL_COMMUNITY): Payer: Medicaid Other | Admitting: Physical Therapy

## 2014-06-24 ENCOUNTER — Ambulatory Visit (HOSPITAL_COMMUNITY): Payer: Medicaid Other | Admitting: Physical Therapy

## 2014-06-28 ENCOUNTER — Ambulatory Visit (HOSPITAL_COMMUNITY): Payer: Medicaid Other | Admitting: Physical Therapy

## 2014-06-30 ENCOUNTER — Ambulatory Visit (HOSPITAL_COMMUNITY): Payer: Medicaid Other

## 2014-07-02 ENCOUNTER — Ambulatory Visit (HOSPITAL_COMMUNITY): Payer: Medicaid Other | Admitting: Physical Therapy

## 2014-07-05 ENCOUNTER — Ambulatory Visit (HOSPITAL_COMMUNITY): Payer: Medicaid Other

## 2014-07-07 ENCOUNTER — Ambulatory Visit (HOSPITAL_COMMUNITY): Payer: Medicaid Other | Admitting: Physical Therapy

## 2014-07-09 ENCOUNTER — Ambulatory Visit (HOSPITAL_COMMUNITY): Payer: Medicaid Other | Admitting: Physical Therapy

## 2014-07-12 ENCOUNTER — Ambulatory Visit (HOSPITAL_COMMUNITY): Payer: Medicaid Other | Admitting: Physical Therapy

## 2014-07-14 ENCOUNTER — Ambulatory Visit (HOSPITAL_COMMUNITY): Payer: Medicaid Other

## 2014-07-16 ENCOUNTER — Ambulatory Visit (HOSPITAL_COMMUNITY): Payer: Medicaid Other | Admitting: Physical Therapy

## 2014-09-13 ENCOUNTER — Encounter: Payer: Self-pay | Admitting: Orthopedic Surgery

## 2014-09-21 ENCOUNTER — Other Ambulatory Visit: Payer: Self-pay | Admitting: *Deleted

## 2014-09-21 DIAGNOSIS — M48061 Spinal stenosis, lumbar region without neurogenic claudication: Secondary | ICD-10-CM

## 2014-09-21 DIAGNOSIS — M25562 Pain in left knee: Secondary | ICD-10-CM

## 2014-09-21 DIAGNOSIS — M25561 Pain in right knee: Secondary | ICD-10-CM

## 2014-09-21 MED ORDER — TRAMADOL-ACETAMINOPHEN 37.5-325 MG PO TABS: 1.0000 | ORAL_TABLET | ORAL | Status: DC | PRN

## 2014-12-14 DIAGNOSIS — M1A3720 Chronic gout due to renal impairment, left ankle and foot, without tophus (tophi): Secondary | ICD-10-CM | POA: Insufficient documentation

## 2015-06-13 ENCOUNTER — Other Ambulatory Visit: Payer: Self-pay | Admitting: *Deleted

## 2015-06-13 DIAGNOSIS — M25561 Pain in right knee: Secondary | ICD-10-CM

## 2015-06-13 DIAGNOSIS — M25562 Pain in left knee: Secondary | ICD-10-CM

## 2015-06-13 DIAGNOSIS — M48061 Spinal stenosis, lumbar region without neurogenic claudication: Secondary | ICD-10-CM

## 2015-06-13 MED ORDER — TRAMADOL-ACETAMINOPHEN 37.5-325 MG PO TABS: 1.0000 | ORAL_TABLET | ORAL | Status: DC | PRN

## 2015-12-15 ENCOUNTER — Ambulatory Visit (HOSPITAL_COMMUNITY): Payer: Medicare Other | Attending: Student

## 2015-12-15 DIAGNOSIS — R293 Abnormal posture: Secondary | ICD-10-CM | POA: Insufficient documentation

## 2015-12-15 DIAGNOSIS — M2560 Stiffness of unspecified joint, not elsewhere classified: Secondary | ICD-10-CM | POA: Diagnosis present

## 2015-12-15 DIAGNOSIS — M545 Low back pain: Secondary | ICD-10-CM | POA: Diagnosis not present

## 2015-12-15 DIAGNOSIS — G8929 Other chronic pain: Secondary | ICD-10-CM | POA: Diagnosis present

## 2015-12-15 DIAGNOSIS — M5441 Lumbago with sciatica, right side: Secondary | ICD-10-CM | POA: Diagnosis present

## 2015-12-15 DIAGNOSIS — M5442 Lumbago with sciatica, left side: Secondary | ICD-10-CM | POA: Diagnosis present

## 2015-12-15 DIAGNOSIS — R269 Unspecified abnormalities of gait and mobility: Secondary | ICD-10-CM | POA: Insufficient documentation

## 2015-12-15 DIAGNOSIS — M6281 Muscle weakness (generalized): Secondary | ICD-10-CM | POA: Insufficient documentation

## 2015-12-15 NOTE — Patient Instructions (Signed)
  SIT TO STAND - YOU MAY USE YOUR HANDS  Start by sitting in a chair. Next, raise up to standing without using your hands for support. 5-10 reps, 2-3x/day, everyday    Seated TVA  Start by sitting on a chair or bed. Inhale all the way through your belly then exhale all the way out while tightening the core until the belly button is pulled in as far as possible.   Hold position for 5 seconds and then return to neutral. Complete 10 reps, 2-3x/day, complete everyday

## 2015-12-16 NOTE — Therapy (Signed)
[facility] [address] [place] [address] [place], [place], [address] Phone: [phone]   Fax:  [phone]  Physical Therapy Evaluation  Patient Details  Name: Julie [name] MRN: [id number] Date of Birth: Feb 22, 1956 Referring Provider: Dr. Lenell Antu  Encounter Date: 12/15/2015      PT End of Session - 12/15/15 1131    Visit Number 1   Number of Visits 13   Date for PT Re-Evaluation 01/13/16   Authorization Type [employer] Medicare Advantage    Authorization Time Period 12/15/2015 to 02/13/2016   PT Start Time 1110   PT Stop Time 1155   PT Time Calculation (min) 45 min   Activity Tolerance Patient tolerated treatment well   Behavior During Therapy [facility] for tasks assessed/performed      Past Medical History  Diagnosis Date  . Hypertension   . Renal disorder   . Neuropathy   . Gout   . Plantar fasciitis   . CHF (congestive heart failure)   . Asthma     Past Surgical History  Procedure Laterality Date  . Abdominal hysterectomy      There were no vitals filed for this visit.  Visit Diagnosis:  Chronic bilateral low back pain without sciatica - Plan: PT plan of care cert/re-cert  Muscle weakness of lower extremity - Plan: PT plan of care cert/re-cert  Joint stiffness of spine - Plan: PT plan of care cert/re-cert  Abnormal posture - Plan: PT plan of care cert/re-cert      Subjective Assessment - 12/15/15 1121    Subjective Mrs. [place] is a 60 yo female who currently c/o chronic LBP that initially began ~3 years ago. Her symptoms have worsened within the last few months. Pt reported that she participated with PT in the past for the same complaints, but was restricted on visits per her health insurance; therefore, she was unable to participate with PT services for more than 1 visit. Pt denied radicular symptoms into either LE and noted that her pain spans across her low back region. LBP was rated a 0/10 on a VAS upon arrival to PT clinic, but she  quickly noted that her pain may elevate as high as a 8/10 on a VAS. Pt admitted that she is not physically active at this time, but is limited with daily activities secondary to her LBP.   Pertinent History DM, obesity, HTN, spinal stenosis, and CHF    Limitations Sitting;Standing;Walking   How long can you sit comfortably? LBP >1 hour    How long can you stand comfortably? LBP >30 minutes   How long can you walk comfortably? LBP >10 minutes   Diagnostic tests X-Rays= Lumbar spinal stenosis per pt report    Patient Stated Goals Pt's main goal includes reducing her LBP and to be able to ambulate longer distances with less pain.    Currently in Pain? No/denies   Pain Score 0-No pain  LBP ranges between a 0-8/10 on a VAS   Pain Location Back   Pain Orientation Lower;Left;Right   Pain Descriptors / Indicators Aching   Pain Type Chronic pain   Pain Radiating Towards No radiating pain reported per pt    Pain Onset More than a month ago   Pain Frequency Intermittent   Aggravating Factors  prolonged standing/walking, lifting >15#, and prlonged sitting >1 hour   Pain Relieving Factors lying prone, prescribed pain meds, and heating pad    Effect of Pain on Daily Activities limiting her ability to ambulate  long distances, household chores (sweeping, wash dishes), and showering    Multiple Pain Sites No                               PT Education - 12/15/15 1849    Education provided Yes   Education Details Educated pt on log rolling technique with supine<>sit transfers, initial HEP, PT eval findings, use of moist heat/cryotherapy (15-20 minutes, 3-4x/day, use of skin barrier with skin check every 5 minutes, and core activation with functional mobility activities    Person(s) Educated Patient   Methods Explanation;Demonstration;Handout   Comprehension Verbalized understanding;Returned demonstration;Need further instruction          PT Short Term Goals - 12/15/15 1908     PT SHORT TERM GOAL #1   Title Patient will independently demo and verbalize full understanding with initial HEP.    Time 2   Period Weeks   Status New   PT SHORT TERM GOAL #2   Title Patient will independently demo and verbalize proper log rolling technique in order to reduce stress on the lumbar spine with sit<>supine transfers.    Time 3   Period Weeks   Status New   PT SHORT TERM GOAL #3   Title Patient will improve SB and rotation AROM by 25% in order to improve spinal mobility with daily activities with less pain.    Time 3   Period Weeks   Status New   PT SHORT TERM GOAL #4   Title Patient will report decreased max LBP to 5/10 on a VAS in order to improve her tolerance with household chores.   Time 3   Period Weeks   Status New           PT Long Term Goals - 12/15/15 1911    PT LONG TERM GOAL #1   Title Patient will improve core and hip strength to 4/5 MMT in order to improve spinal stabilization with lifting activities.    Time 6   Period Weeks   Status New   PT LONG TERM GOAL #2   Title Patient will report decreased LBP to 0-3/10 on a VAS in order to complete household chores with less difficulty.     Time 6   Period Weeks   Status New   PT LONG TERM GOAL #3   Title Patient will be able to ambulate 30 minutes with LBP remaining <3/10 on a VAS in order to ambulate in the community with less restrictions.    Time 6   Period Weeks   Status New   PT LONG TERM GOAL #4   Title Patient's FOTO score will improve by 20%  in order to progress towards her PLOF with leisure activities.    Time 6   Period Weeks   Status New               Plan - 12/15/15 1851    Clinical Impression Statement Mrs. [place]ley is a 60 yo female who was referred to outpatient PT with initial complaints of chronic LBP without radicular symptoms. The patient presents with signs and symptoms that are consistent with possible disc involvement of the lumbar spine with increased symptoms noted  with repeated lumbar flexion and reduced/no change in symptoms with repeated lumbar extension. Will plan to further assess directional preference at next few PT visits. Slump test and SLR test were completed with no signs of radicular symptoms assessed.  Decreased lumbar P to A joint mobility assessed at L3,L4, and L5. Furthermore, the pt currently presents with impairments including bilateral gross hip weakness, decreased bilateral HS/quad/hip flexor/piriformis muscle flexibility, abnormal postural alignment, weak core musculature, and decreased lumbar spine AROM. The pt would benefit from skilled PT in order to improve core/LE strength, reduce pain levels, improve flexibility, and improve lumbar ROM with daily activities.    Pt will benefit from skilled therapeutic intervention in order to improve on the following deficits Decreased activity tolerance;Decreased mobility;Decreased range of motion;Decreased strength;Hypomobility;Difficulty walking;Impaired flexibility;Postural dysfunction;Improper body mechanics;Obesity;Pain   Rehab Potential Fair   Clinical Impairments Affecting Rehab Potential chronic hx of LBP and hx of obesity   PT Frequency 2x / week   PT Duration 6 weeks   PT Treatment/Interventions ADLs/Self Care Home Management;Cryotherapy;Moist Heat;Therapeutic exercise;Therapeutic activities;Functional mobility training;Ultrasound;Patient/family education;Manual techniques;Taping;Passive range of motion   PT Next Visit Plan Next visit to focus on core stabilization in supine position level 1 and 2, piriformis/hip flexor/quad stretches, hip 4-way strengthening, and pt education regarding posture and pain management. Complete FOTO at next visit    PT Home Exercise Plan HEP was initiated this visit with addition of seated Tr. abdominis activation and repeated sit to stand    Recommended Other Services None at this time    Consulted and Agree with Plan of Care Patient          G-Codes - 01/12/16  1919    Functional Assessment Tool Used Based on clinical judgement/ PT eval findings    Functional Limitation Mobility: Walking and moving around   Mobility: Walking and Moving Around Current Status [phone]) At least 40 percent but less than 60 percent impaired, limited or restricted   Mobility: Walking and Moving Around Goal Status [phone]) At least 20 percent but less than 40 percent impaired, limited or restricted       Problem List Patient Active Problem List   Diagnosis Date Noted  . Stiffness of joint, not elsewhere classified, pelvic region and thigh 06/01/2014  . Lumbago 06/01/2014  . Muscle weakness (generalized) 06/01/2014  . Abnormality of gait 06/01/2014  . Spinal stenosis of lumbar region 07/16/2013  . LBBB (left bundle branch block) 01/15/2013  . HTN (hypertension) 01/15/2013  . Diastolic CHF, chronic ([place]) 01/15/2013    Garen Lah, PT, DPT  12/16/2015, 12:50 PM  [facility] [address] [place], [place], [address] Phone: [phone]   Fax:  [phone]  Name: Jahmya Moxley MRN: [id number] Date of Birth: 12-04-1955

## 2015-12-19 ENCOUNTER — Ambulatory Visit (HOSPITAL_COMMUNITY): Payer: Medicare Other | Admitting: Physical Therapy

## 2015-12-19 DIAGNOSIS — R269 Unspecified abnormalities of gait and mobility: Secondary | ICD-10-CM

## 2015-12-19 DIAGNOSIS — M545 Low back pain, unspecified: Secondary | ICD-10-CM

## 2015-12-19 DIAGNOSIS — R293 Abnormal posture: Secondary | ICD-10-CM

## 2015-12-19 DIAGNOSIS — G8929 Other chronic pain: Secondary | ICD-10-CM

## 2015-12-19 DIAGNOSIS — M6281 Muscle weakness (generalized): Secondary | ICD-10-CM

## 2015-12-19 DIAGNOSIS — M2560 Stiffness of unspecified joint, not elsewhere classified: Secondary | ICD-10-CM

## 2015-12-19 DIAGNOSIS — M5442 Lumbago with sciatica, left side: Secondary | ICD-10-CM

## 2015-12-19 DIAGNOSIS — M5441 Lumbago with sciatica, right side: Secondary | ICD-10-CM

## 2015-12-19 NOTE — Therapy (Signed)
[facility] [place], [place], [address] Phone: [phone]   Fax:  [phone]  Physical Therapy Treatment  Patient Details  Name: Julie [name] MRN: [id number] Date of Birth: May 28, 1956 Referring Provider: Dr. Lenell Antu  Encounter Date: 12/19/2015      PT End of Session - 12/19/15 1749    Visit Number 2   Number of Visits 13   Date for PT Re-Evaluation 01/13/16   Authorization Type [employer]HC Medicare Advantage    Authorization Time Period 12/15/2015 to 02/13/2016   Authorization - Visit Number 2   Authorization - Number of Visits 10   PT Start Time [id number]   PT Stop Time 1728   PT Time Calculation (min) 38 min   Activity Tolerance Patient tolerated treatment well   Behavior During Therapy [facility] for tasks assessed/performed      Past Medical History  Diagnosis Date  . Hypertension   . Renal disorder   . Neuropathy   . Gout   . Plantar fasciitis   . CHF (congestive heart failure)   . Asthma     Past Surgical History  Procedure Laterality Date  . Abdominal hysterectomy      There were no vitals filed for this visit.  Visit Diagnosis:  Chronic bilateral low back pain without sciatica  Muscle weakness of lower extremity  Joint stiffness of spine  Abnormal posture  Bilateral low back pain with sciatica, sciatica laterality unspecified  Muscle weakness (generalized)  Abnormality of gait      Subjective Assessment - 12/19/15 1652    Subjective Patient reports that she has not tried any HEP exercises yet due to ankle injury; doing well today overall however.    Pertinent History DM, obesity, HTN, spinal stenosis, and CHF    Currently in Pain? Yes   Pain Score 8    Pain Location Back   Pain Orientation Lower;Left   Pain Descriptors / Indicators Sharp   Pain Type Chronic pain   Pain Onset More than a month ago                         [facility] Adult PT Treatment/Exercise - 12/19/15 0001    Lumbar Exercises: Stretches   Active Hamstring Stretch 2 reps;30 seconds   Active Hamstring Stretch Limitations 12 inch box    Passive Hamstring Stretch 3 reps;30 seconds   Passive Hamstring Stretch Limitations gastroc on slantboard    Standing Extension Other (comment)   Standing Extension Limitations x10 times   Prone on Elbows Stretch 1 rep;60 seconds   Press Ups Other (comment)   Press Ups Limitations x10   Piriformis Stretch 2 reps;30 seconds   Piriformis Stretch Limitations passive, supine    Lumbar Exercises: Seated   Other Seated Lumbar Exercises TrA activations 1x15, 3 second holds    Knee/Hip Exercises: Standing   Rocker Board 2 minutes   Rocker Board Limitations AP and lateral U HHA    Other Standing Knee Exercises 3D hip excursions 1x10                PT Education - 12/19/15 1748    Education provided Yes   Education Details reviewed HEP and goals, initial eval   Person(s) Educated Patient   Methods Explanation;Handout   Comprehension Verbalized understanding          PT Short Term Goals - 12/15/15 1908    PT SHORT TERM GOAL #1   Title  Patient will independently demo and verbalize full understanding with initial HEP.    Time 2   Period Weeks   Status New   PT SHORT TERM GOAL #2   Title Patient will independently demo and verbalize proper log rolling technique in order to reduce stress on the lumbar spine with sit<>supine transfers.    Time 3   Period Weeks   Status New   PT SHORT TERM GOAL #3   Title Patient will improve SB and rotation AROM by 25% in order to improve spinal mobility with daily activities with less pain.    Time 3   Period Weeks   Status New   PT SHORT TERM GOAL #4   Title Patient will report decreased max LBP to 5/10 on a VAS in order to improve her tolerance with household chores.   Time 3   Period Weeks   Status New           PT Long Term Goals - 12/15/15 1911    PT LONG TERM GOAL #1   Title Patient will improve  core and hip strength to 4/5 MMT in order to improve spinal stabilization with lifting activities.    Time 6   Period Weeks   Status New   PT LONG TERM GOAL #2   Title Patient will report decreased LBP to 0-3/10 on a VAS in order to complete household chores with less difficulty.     Time 6   Period Weeks   Status New   PT LONG TERM GOAL #3   Title Patient will be able to ambulate 30 minutes with LBP remaining <3/10 on a VAS in order to ambulate in the community with less restrictions.    Time 6   Period Weeks   Status New   PT LONG TERM GOAL #4   Title Patient's FOTO score will improve by 20%  in order to progress towards her PLOF with leisure activities.    Time 6   Period Weeks   Status New               Plan - 12/19/15 1749    Clinical Impression Statement Introduced functional stretches and exercises today with focus on repeated extensions, which was consistent in reducing back pain today. Difficulty with piriformis stretches even in supine figure four today and required PT to perform these for her passively; noted increased hip tightness R as compared to L. Cues for proper performance of all exercises today. Fatigue noted throughout session and one rest break required.    Pt will benefit from skilled therapeutic intervention in order to improve on the following deficits Decreased activity tolerance;Decreased mobility;Decreased range of motion;Decreased strength;Hypomobility;Difficulty walking;Impaired flexibility;Postural dysfunction;Improper body mechanics;Obesity;Pain   Rehab Potential Fair   Clinical Impairments Affecting Rehab Potential chronic hx of LBP and hx of obesity   PT Frequency 2x / week   PT Duration 6 weeks   PT Treatment/Interventions ADLs/Self Care Home Management;Cryotherapy;Moist Heat;Therapeutic exercise;Therapeutic activities;Functional mobility training;Ultrasound;Patient/family education;Manual techniques;Taping;Passive range of motion   PT Next  Visit Plan Next visit to focus on core stabilization in supine position level 1 and 2, piriformis/hip flexor/quad stretches, hip 4-way strengthening, and pt education regarding posture and pain management. Complete FOTO at next visit    PT Home Exercise Plan HEP was initiated this visit with addition of seated Tr. abdominis activation and repeated sit to stand    Consulted and Agree with Plan of Care Patient  Problem List Patient Active Problem List   Diagnosis Date Noted  . Stiffness of joint, not elsewhere classified, pelvic region and thigh 06/01/2014  . Lumbago 06/01/2014  . Muscle weakness (generalized) 06/01/2014  . Abnormality of gait 06/01/2014  . Spinal stenosis of lumbar region 07/16/2013  . LBBB (left bundle branch block) 01/15/2013  . HTN (hypertension) 01/15/2013  . Diastolic CHF, chronic ([place]) 01/15/2013    Deniece Ree PT, DPT [facility] [address] [place], [place], [address] Phone: [phone]   Fax:  [phone]  Name: Julie [name] MRN: [id number] Date of Birth: 1956/08/24

## 2016-01-02 ENCOUNTER — Telehealth (HOSPITAL_COMMUNITY): Payer: Self-pay

## 2016-01-02 ENCOUNTER — Ambulatory Visit (HOSPITAL_COMMUNITY): Payer: Medicare Other

## 2016-01-02 NOTE — Telephone Encounter (Signed)
No show, called and left message about missed apt, explained no show policy and included next scheduled apt date and time with contact information given.  [address] [place]; [employer] [phone]

## 2016-01-04 ENCOUNTER — Telehealth (HOSPITAL_COMMUNITY): Payer: Self-pay

## 2016-01-04 ENCOUNTER — Ambulatory Visit (HOSPITAL_COMMUNITY): Payer: Medicare Other

## 2016-01-04 DIAGNOSIS — M5441 Lumbago with sciatica, right side: Secondary | ICD-10-CM

## 2016-01-04 DIAGNOSIS — M545 Low back pain: Principal | ICD-10-CM

## 2016-01-04 DIAGNOSIS — M2560 Stiffness of unspecified joint, not elsewhere classified: Secondary | ICD-10-CM

## 2016-01-04 DIAGNOSIS — R293 Abnormal posture: Secondary | ICD-10-CM

## 2016-01-04 DIAGNOSIS — M6281 Muscle weakness (generalized): Secondary | ICD-10-CM

## 2016-01-04 DIAGNOSIS — R269 Unspecified abnormalities of gait and mobility: Secondary | ICD-10-CM

## 2016-01-04 DIAGNOSIS — M5442 Lumbago with sciatica, left side: Secondary | ICD-10-CM

## 2016-01-04 DIAGNOSIS — G8929 Other chronic pain: Secondary | ICD-10-CM

## 2016-01-04 NOTE — Telephone Encounter (Signed)
Pt no show at 15 minutes past apt. time, called and spoke to pt who stated she was on her way to clinic.  [address] [place]; [employer] [phone]'

## 2016-01-04 NOTE — Therapy (Addendum)
[place], [place], [address] Phone: [phone]   Fax:  [phone]  Physical Therapy Treatment  Patient Details  Name: Julie [name] MRN: [id number] Date of Birth: 1956-08-22 Referring Provider: Dr. Lenell Antu  Encounter Date: 01/04/2016      PT End of Session - 01/04/16 1420    Visit Number 3   Number of Visits 13   Date for PT Re-Evaluation 01/13/16   Authorization Type [employer] Medicare Advantage    Authorization Time Period 12/15/2015 to 02/13/2016   Authorization - Visit Number 3   Authorization - Number of Visits 10   PT Start Time 1410   PT Stop Time 1430   PT Time Calculation (min) 20 min   Activity Tolerance Patient tolerated treatment well   Behavior During Therapy [facility] for tasks assessed/performed      Past Medical History  Diagnosis Date  . Hypertension   . Renal disorder   . Neuropathy   . Gout   . Plantar fasciitis   . CHF (congestive heart failure)   . Asthma     Past Surgical History  Procedure Laterality Date  . Abdominal hysterectomy      There were no vitals filed for this visit.  Visit Diagnosis:  Chronic bilateral low back pain without sciatica  Muscle weakness of lower extremity  Joint stiffness of spine  Abnormal posture  Bilateral low back pain with sciatica, sciatica laterality unspecified  Muscle weakness (generalized)  Abnormality of gait      Subjective Assessment - 01/04/16 1414    Subjective Pt arrived 25 minutes late for apt today.  Reports her back is feeling good, has had other health concerns with chest pain reason she missed last apt.   Pertinent History DM, obesity, HTN, spinal stenosis, and CHF    Patient Stated Goals Pt's main goal includes reducing her LBP and to be able to ambulate longer distances with less pain.    Currently in Pain? No/denies           [facility] Adult PT Treatment/Exercise - 01/04/16 0001    Lumbar Exercises: Stretches   Piriformis Stretch 1 rep;30 seconds   Piriformis Stretch Limitations seated with UE A for Rt LE   Lumbar Exercises: Supine   Ab Set 10 reps;5 seconds  2 sets   AB Set Limitations multimodal cueing; improved activation with pelvic tilts   Bent Knee Raise 10 reps;3 seconds   Bent Knee Raise Limitations with core activation   Bridge 5 reps   Lumbar Exercises: Sidelying   Hip Abduction 5 reps             PT Short Term Goals - 12/15/15 1908    PT SHORT TERM GOAL #1   Title Patient will independently demo and verbalize full understanding with initial HEP.    Time 2   Period Weeks   Status New   PT SHORT TERM GOAL #2   Title Patient will independently demo and verbalize proper log rolling technique in order to reduce stress on the lumbar spine with sit<>supine transfers.    Time 3   Period Weeks   Status New   PT SHORT TERM GOAL #3   Title Patient will improve SB and rotation AROM by 25% in order to improve spinal mobility with daily activities with less pain.    Time 3   Period Weeks   Status New   PT SHORT TERM GOAL #4   Title Patient  will report decreased max LBP to 5/10 on a VAS in order to improve her tolerance with household chores.   Time 3   Period Weeks   Status New           PT Long Term Goals - 12/15/15 1911    PT LONG TERM GOAL #1   Title Patient will improve core and hip strength to 4/5 MMT in order to improve spinal stabilization with lifting activities.    Time 6   Period Weeks   Status New   PT LONG TERM GOAL #2   Title Patient will report decreased LBP to 0-3/10 on a VAS in order to complete household chores with less difficulty.     Time 6   Period Weeks   Status New   PT LONG TERM GOAL #3   Title Patient will be able to ambulate 30 minutes with LBP remaining <3/10 on a VAS in order to ambulate in the community with less restrictions.    Time 6   Period Weeks   Status New   PT LONG TERM GOAL #4   Title Patient's FOTO score will improve by  20%  in order to progress towards her PLOF with leisure activities.    Time 6   Period Weeks   Status New               Plan - 01/04/16 1420    Clinical Impression Statement Pt 25 min late for apt.  Session focus on improving core activation to assist with lumbar stability.  Pt demonstrated weak core musculature requiring multimodal cueing to improve core activation, most successful wtih posterior pelvic tilts for activation.  Pt reported she felt nauseous following 3 supine exercises, pt sat up and water offered.  Pt unable to resume supine position, did complete sidelying abduction for proximal musculature strengthening.  Piriformis stretches complete in seated position this session with UE A required for Rt LE due to tightness and cueing for posture in position.  Pt willl continue to benefit from cueing to improve core activation static as well as dynamic once activated.   Pt will benefit from skilled therapeutic intervention in order to improve on the following deficits Decreased activity tolerance;Decreased mobility;Decreased range of motion;Decreased strength;Hypomobility;Difficulty walking;Impaired flexibility;Postural dysfunction;Improper body mechanics;Obesity;Pain   Clinical Impairments Affecting Rehab Potential chronic hx of LBP and hx of obesity   PT Frequency 2x / week   PT Duration 6 weeks   PT Treatment/Interventions ADLs/Self Care Home Management;Cryotherapy;Moist Heat;Therapeutic exercise;Therapeutic activities;Functional mobility training;Ultrasound;Patient/family education;Manual techniques;Taping;Passive range of motion   PT Next Visit Plan Next visit to focus on core stabilization in supine position level 1 and 2, piriformis/hip flexor/quad stretches, hip 4-way strengthening, and pt education regarding posture and pain management.    PT Home Exercise Plan no additional exercises given this session, encouraged to continue current HEP        Problem List Patient Active  Problem List   Diagnosis Date Noted  . Stiffness of joint, not elsewhere classified, pelvic region and thigh 06/01/2014  . Lumbago 06/01/2014  . Muscle weakness (generalized) 06/01/2014  . Abnormality of gait 06/01/2014  . Spinal stenosis of lumbar region 07/16/2013  . LBBB (left bundle branch block) 01/15/2013  . HTN (hypertension) 01/15/2013  . Diastolic CHF, chronic ([place]) 01/15/2013   PHYSICAL THERAPY DISCHARGE SUMMARY  Visits from Start of Care: 3   Current functional level related to goals / functional outcomes: Unknown due to DC completed without a visit  Remaining deficits: Patient was called after consistent no show to PT appts. Patient reported that she is having heart issues and is scheduled to see a cardiologist and wishes to be discharged from outpatient PT at this time.     Education / Equipment: N/A  Plan: Patient agrees to discharge.  Patient goals were not met. Patient is being discharged due to the patient's request.  ?????       Garen Lah, PT, DPT   Ihor Austin, LPTA; [place]  Aldona Lento 01/04/2016, 5:19 PM  [place], [place], [address] Phone: [phone]   Fax:  [phone]  Name: Julie [name] MRN: [id number] Date of Birth: 1956-04-17

## 2016-01-09 ENCOUNTER — Ambulatory Visit (HOSPITAL_COMMUNITY): Payer: Medicare Other

## 2016-01-11 ENCOUNTER — Ambulatory Visit (HOSPITAL_COMMUNITY): Payer: Medicare Other | Attending: Student

## 2016-01-16 ENCOUNTER — Ambulatory Visit (HOSPITAL_COMMUNITY): Payer: Medicare Other

## 2016-01-16 ENCOUNTER — Telehealth (HOSPITAL_COMMUNITY): Payer: Self-pay

## 2016-01-16 NOTE — Telephone Encounter (Signed)
Therapist called and spoke to patient regarding her missed PT appt at 2:30 pm this date. Pt noted that she was "not feeling well" and that she was having some " medical issues" including shortness of breath that began yesterday. Therapist instructed the pt to call her PCP to report complaints. Pt stated that she had already called today and is awaiting for a call back from her PCP office. Therapist further instructed the pt to seek medical attention at the ED if her symptoms worsen or if her MD does not return her call. The pt verbalized full understanding and agreement with therapist instructions.   Garen Lah, PT, DPT

## 2016-01-18 ENCOUNTER — Encounter (HOSPITAL_COMMUNITY): Payer: Medicare Other

## 2016-01-23 ENCOUNTER — Telehealth (HOSPITAL_COMMUNITY): Payer: Self-pay | Admitting: Physical Therapy

## 2016-01-23 ENCOUNTER — Ambulatory Visit (HOSPITAL_COMMUNITY): Payer: Medicare Other | Admitting: Physical Therapy

## 2016-01-23 NOTE — Telephone Encounter (Signed)
Pt did not show for appointment.  Called and discussed and stated she is having heart issues, is scheduled to see a cardiologist and wishes to be discharged at this time.  Message sent to evaluating therapist to be discharged.   Teena Irani, PTA/CLT [phone]

## 2016-01-25 ENCOUNTER — Encounter (HOSPITAL_COMMUNITY): Payer: Medicare Other

## 2016-01-26 ENCOUNTER — Other Ambulatory Visit: Payer: Self-pay | Admitting: Orthopedic Surgery

## 2016-01-30 ENCOUNTER — Encounter (HOSPITAL_COMMUNITY): Payer: Medicare Other

## 2016-01-30 ENCOUNTER — Other Ambulatory Visit: Payer: Self-pay | Admitting: *Deleted

## 2016-01-30 DIAGNOSIS — M25562 Pain in left knee: Secondary | ICD-10-CM

## 2016-01-30 DIAGNOSIS — M25561 Pain in right knee: Secondary | ICD-10-CM

## 2016-01-30 DIAGNOSIS — M48061 Spinal stenosis, lumbar region without neurogenic claudication: Secondary | ICD-10-CM

## 2016-01-30 MED ORDER — TRAMADOL-ACETAMINOPHEN 37.5-325 MG PO TABS: 1.0000 | ORAL_TABLET | ORAL | Status: DC | PRN

## 2016-02-01 ENCOUNTER — Encounter (HOSPITAL_COMMUNITY): Payer: Medicare Other

## 2016-04-05 DIAGNOSIS — E1122 Type 2 diabetes mellitus with diabetic chronic kidney disease: Secondary | ICD-10-CM | POA: Insufficient documentation

## 2016-09-19 DIAGNOSIS — T82868A Thrombosis of vascular prosthetic devices, implants and grafts, initial encounter: Secondary | ICD-10-CM | POA: Insufficient documentation

## 2016-12-14 DIAGNOSIS — R011 Cardiac murmur, unspecified: Secondary | ICD-10-CM | POA: Insufficient documentation

## 2016-12-21 ENCOUNTER — Other Ambulatory Visit: Payer: Self-pay | Admitting: Orthopedic Surgery

## 2016-12-21 DIAGNOSIS — M25562 Pain in left knee: Secondary | ICD-10-CM

## 2016-12-21 DIAGNOSIS — M25561 Pain in right knee: Secondary | ICD-10-CM

## 2016-12-21 DIAGNOSIS — M48061 Spinal stenosis, lumbar region without neurogenic claudication: Secondary | ICD-10-CM

## 2017-08-29 DIAGNOSIS — J040 Acute laryngitis: Secondary | ICD-10-CM | POA: Insufficient documentation

## 2017-10-17 DIAGNOSIS — R200 Anesthesia of skin: Secondary | ICD-10-CM | POA: Insufficient documentation

## 2018-01-14 DIAGNOSIS — G5603 Carpal tunnel syndrome, bilateral upper limbs: Secondary | ICD-10-CM | POA: Insufficient documentation

## 2018-12-04 DIAGNOSIS — M65321 Trigger finger, right index finger: Secondary | ICD-10-CM | POA: Insufficient documentation

## 2018-12-04 DIAGNOSIS — M65311 Trigger thumb, right thumb: Secondary | ICD-10-CM | POA: Insufficient documentation

## 2020-11-16 DIAGNOSIS — I480 Paroxysmal atrial fibrillation: Secondary | ICD-10-CM | POA: Insufficient documentation

## 2020-11-16 DIAGNOSIS — I4891 Unspecified atrial fibrillation: Secondary | ICD-10-CM | POA: Insufficient documentation

## 2020-11-16 DIAGNOSIS — I5030 Unspecified diastolic (congestive) heart failure: Secondary | ICD-10-CM | POA: Insufficient documentation

## 2020-12-15 DIAGNOSIS — R042 Hemoptysis: Secondary | ICD-10-CM | POA: Insufficient documentation

## 2020-12-15 DIAGNOSIS — I272 Pulmonary hypertension, unspecified: Secondary | ICD-10-CM | POA: Insufficient documentation

## 2020-12-17 ENCOUNTER — Emergency Department (HOSPITAL_COMMUNITY): Payer: Medicare Other

## 2020-12-17 ENCOUNTER — Other Ambulatory Visit: Payer: Self-pay

## 2020-12-17 ENCOUNTER — Inpatient Hospital Stay (HOSPITAL_COMMUNITY)
Admission: EM | Admit: 2020-12-17 | Discharge: 2020-12-21 | DRG: 177 | Disposition: A | Discharge status: Home Health | Payer: Medicare Other | Attending: Family Medicine | Admitting: Family Medicine

## 2020-12-17 ENCOUNTER — Encounter (HOSPITAL_COMMUNITY): Payer: Self-pay | Admitting: Emergency Medicine

## 2020-12-17 DIAGNOSIS — I447 Left bundle-branch block, unspecified: Secondary | ICD-10-CM | POA: Diagnosis present

## 2020-12-17 DIAGNOSIS — Z87891 Personal history of nicotine dependence: Secondary | ICD-10-CM

## 2020-12-17 DIAGNOSIS — E1122 Type 2 diabetes mellitus with diabetic chronic kidney disease: Secondary | ICD-10-CM | POA: Diagnosis present

## 2020-12-17 DIAGNOSIS — E876 Hypokalemia: Secondary | ICD-10-CM | POA: Diagnosis present

## 2020-12-17 DIAGNOSIS — R531 Weakness: Secondary | ICD-10-CM | POA: Diagnosis not present

## 2020-12-17 DIAGNOSIS — K219 Gastro-esophageal reflux disease without esophagitis: Secondary | ICD-10-CM | POA: Diagnosis present

## 2020-12-17 DIAGNOSIS — N2581 Secondary hyperparathyroidism of renal origin: Secondary | ICD-10-CM | POA: Diagnosis present

## 2020-12-17 DIAGNOSIS — J1282 Pneumonia due to coronavirus disease 2019: Secondary | ICD-10-CM | POA: Diagnosis present

## 2020-12-17 DIAGNOSIS — I5032 Chronic diastolic (congestive) heart failure: Secondary | ICD-10-CM | POA: Diagnosis present

## 2020-12-17 DIAGNOSIS — Z992 Dependence on renal dialysis: Secondary | ICD-10-CM | POA: Diagnosis not present

## 2020-12-17 DIAGNOSIS — Z6841 Body Mass Index (BMI) 40.0 and over, adult: Secondary | ICD-10-CM | POA: Diagnosis not present

## 2020-12-17 DIAGNOSIS — Z23 Encounter for immunization: Secondary | ICD-10-CM | POA: Diagnosis not present

## 2020-12-17 DIAGNOSIS — I132 Hypertensive heart and chronic kidney disease with heart failure and with stage 5 chronic kidney disease, or end stage renal disease: Secondary | ICD-10-CM | POA: Diagnosis present

## 2020-12-17 DIAGNOSIS — D631 Anemia in chronic kidney disease: Secondary | ICD-10-CM | POA: Diagnosis present

## 2020-12-17 DIAGNOSIS — I4891 Unspecified atrial fibrillation: Secondary | ICD-10-CM | POA: Diagnosis present

## 2020-12-17 DIAGNOSIS — Z7901 Long term (current) use of anticoagulants: Secondary | ICD-10-CM

## 2020-12-17 DIAGNOSIS — N186 End stage renal disease: Secondary | ICD-10-CM | POA: Diagnosis present

## 2020-12-17 DIAGNOSIS — Z7984 Long term (current) use of oral hypoglycemic drugs: Secondary | ICD-10-CM | POA: Diagnosis not present

## 2020-12-17 DIAGNOSIS — Z90710 Acquired absence of both cervix and uterus: Secondary | ICD-10-CM | POA: Diagnosis not present

## 2020-12-17 DIAGNOSIS — R778 Other specified abnormalities of plasma proteins: Secondary | ICD-10-CM

## 2020-12-17 DIAGNOSIS — J449 Chronic obstructive pulmonary disease, unspecified: Secondary | ICD-10-CM | POA: Diagnosis present

## 2020-12-17 DIAGNOSIS — I272 Pulmonary hypertension, unspecified: Secondary | ICD-10-CM | POA: Diagnosis present

## 2020-12-17 DIAGNOSIS — Z79899 Other long term (current) drug therapy: Secondary | ICD-10-CM

## 2020-12-17 DIAGNOSIS — E669 Obesity, unspecified: Secondary | ICD-10-CM | POA: Diagnosis present

## 2020-12-17 DIAGNOSIS — U071 COVID-19: Secondary | ICD-10-CM | POA: Diagnosis present

## 2020-12-17 DIAGNOSIS — M109 Gout, unspecified: Secondary | ICD-10-CM | POA: Diagnosis present

## 2020-12-17 DIAGNOSIS — I361 Nonrheumatic tricuspid (valve) insufficiency: Secondary | ICD-10-CM | POA: Diagnosis not present

## 2020-12-17 DIAGNOSIS — R931 Abnormal findings on diagnostic imaging of heart and coronary circulation: Secondary | ICD-10-CM | POA: Diagnosis not present

## 2020-12-17 DIAGNOSIS — I350 Nonrheumatic aortic (valve) stenosis: Secondary | ICD-10-CM | POA: Diagnosis not present

## 2020-12-17 DIAGNOSIS — I340 Nonrheumatic mitral (valve) insufficiency: Secondary | ICD-10-CM | POA: Diagnosis not present

## 2020-12-17 DIAGNOSIS — J9601 Acute respiratory failure with hypoxia: Secondary | ICD-10-CM | POA: Diagnosis present

## 2020-12-17 DIAGNOSIS — M79605 Pain in left leg: Secondary | ICD-10-CM | POA: Diagnosis not present

## 2020-12-17 LAB — BASIC METABOLIC PANEL
Anion gap: 14 (ref 5–15)
BUN: 17 mg/dL (ref 8–23)
CO2: 25 mmol/L (ref 22–32)
Calcium: 7.9 mg/dL — ABNORMAL LOW (ref 8.9–10.3)
Chloride: 97 mmol/L — ABNORMAL LOW (ref 98–111)
Creatinine, Ser: 8.69 mg/dL — ABNORMAL HIGH (ref 0.44–1.00)
GFR, Estimated: 5 mL/min — ABNORMAL LOW (ref >60)
Glucose, Bld: 120 mg/dL — ABNORMAL HIGH (ref 70–99)
Potassium: 3.2 mmol/L — ABNORMAL LOW (ref 3.5–5.1)
Sodium: 136 mmol/L (ref 135–145)

## 2020-12-17 LAB — FIBRINOGEN: Fibrinogen: 430 mg/dL (ref 210–475)

## 2020-12-17 LAB — C-REACTIVE PROTEIN: CRP: 1.4 mg/dL — ABNORMAL HIGH (ref <1.0)

## 2020-12-17 LAB — FERRITIN: Ferritin: 3295 ng/mL — ABNORMAL HIGH (ref 11–307)

## 2020-12-17 LAB — CBC
HCT: 23.3 % — ABNORMAL LOW (ref 36.0–46.0)
Hemoglobin: 7.5 g/dL — ABNORMAL LOW (ref 12.0–15.0)
MCH: 29.2 pg (ref 26.0–34.0)
MCHC: 32.2 g/dL (ref 30.0–36.0)
MCV: 90.7 fL (ref 80.0–100.0)
Platelets: 198 10*3/uL (ref 150–400)
RBC: 2.57 MIL/uL — ABNORMAL LOW (ref 3.87–5.11)
RDW: 13.7 % (ref 11.5–15.5)
WBC: 3.9 10*3/uL — ABNORMAL LOW (ref 4.0–10.5)
nRBC: 0 % (ref 0.0–0.2)

## 2020-12-17 LAB — TRIGLYCERIDES: Triglycerides: 176 mg/dL — ABNORMAL HIGH (ref <150)

## 2020-12-17 LAB — D-DIMER, QUANTITATIVE: D-Dimer, Quant: 0.99 ug/mL-FEU — ABNORMAL HIGH (ref 0.00–0.50)

## 2020-12-17 LAB — GLUCOSE, CAPILLARY: Glucose-Capillary: 83 mg/dL (ref 70–99)

## 2020-12-17 LAB — CBG MONITORING, ED: Glucose-Capillary: 109 mg/dL — ABNORMAL HIGH (ref 70–99)

## 2020-12-17 LAB — TROPONIN I (HIGH SENSITIVITY)
Troponin I (High Sensitivity): 61 ng/L — ABNORMAL HIGH (ref <18)
Troponin I (High Sensitivity): 63 ng/L — ABNORMAL HIGH (ref <18)

## 2020-12-17 LAB — PROCALCITONIN: Procalcitonin: 0.5 ng/mL

## 2020-12-17 LAB — LACTATE DEHYDROGENASE: LDH: 347 U/L — ABNORMAL HIGH (ref 98–192)

## 2020-12-17 LAB — LACTIC ACID, PLASMA: Lactic Acid, Venous: 0.8 mmol/L (ref 0.5–1.9)

## 2020-12-17 LAB — POC SARS CORONAVIRUS 2 AG -  ED: SARS Coronavirus 2 Ag: POSITIVE — AB

## 2020-12-17 MED ORDER — NIFEDIPINE ER OSMOTIC RELEASE 90 MG PO TB24
90.0000 mg | ORAL_TABLET | Freq: Every day | ORAL | Status: DC
  Administered 2020-12-17 – 2020-12-21 (×5): 90 mg via ORAL
  Filled 2020-12-17 (×6): qty 1

## 2020-12-17 MED ORDER — DEXAMETHASONE SODIUM PHOSPHATE 10 MG/ML IJ SOLN: 8.0000 mg | Freq: Once | INTRAMUSCULAR | Status: DC

## 2020-12-17 MED ORDER — FUROSEMIDE 80 MG PO TABS
80.0000 mg | ORAL_TABLET | Freq: Every day | ORAL | Status: DC
  Administered 2020-12-17 – 2020-12-19 (×3): 80 mg via ORAL
  Filled 2020-12-17 (×3): qty 1

## 2020-12-17 MED ORDER — GABAPENTIN 300 MG PO CAPS: 300.0000 mg | ORAL_CAPSULE | Freq: Two times a day (BID) | ORAL | Status: DC

## 2020-12-17 MED ORDER — IOHEXOL 350 MG/ML SOLN
100.0000 mL | Freq: Once | INTRAVENOUS | Status: AC | PRN
  Administered 2020-12-17: 100 mL via INTRAVENOUS

## 2020-12-17 MED ORDER — LINAGLIPTIN 5 MG PO TABS
5.0000 mg | ORAL_TABLET | Freq: Every day | ORAL | Status: DC
  Administered 2020-12-17 – 2020-12-21 (×5): 5 mg via ORAL
  Filled 2020-12-17 (×5): qty 1

## 2020-12-17 MED ORDER — ATORVASTATIN CALCIUM 10 MG PO TABS: 10.0000 mg | ORAL_TABLET | Freq: Every day | ORAL | Status: DC

## 2020-12-17 MED ORDER — AMLODIPINE BESYLATE 5 MG PO TABS: 10.0000 mg | ORAL_TABLET | Freq: Every day | ORAL | Status: DC

## 2020-12-17 MED ORDER — ALBUTEROL SULFATE HFA 108 (90 BASE) MCG/ACT IN AERS
2.0000 | INHALATION_SPRAY | RESPIRATORY_TRACT | Status: DC | PRN
  Filled 2020-12-17: qty 6.7

## 2020-12-17 MED ORDER — POTASSIUM CHLORIDE 20 MEQ PO PACK
40.0000 meq | PACK | Freq: Once | ORAL | Status: AC
  Administered 2020-12-17: 40 meq via ORAL
  Filled 2020-12-17: qty 2

## 2020-12-17 MED ORDER — HYDRALAZINE HCL 25 MG PO TABS
25.0000 mg | ORAL_TABLET | Freq: Three times a day (TID) | ORAL | Status: DC
  Administered 2020-12-17 – 2020-12-20 (×3): 25 mg via ORAL
  Filled 2020-12-17 (×7): qty 1

## 2020-12-17 MED ORDER — ACETAMINOPHEN 325 MG PO TABS
650.0000 mg | ORAL_TABLET | Freq: Four times a day (QID) | ORAL | Status: DC | PRN
  Administered 2020-12-17 – 2020-12-20 (×2): 650 mg via ORAL
  Filled 2020-12-17 (×4): qty 2

## 2020-12-17 MED ORDER — DILTIAZEM HCL ER COATED BEADS 120 MG PO CP24
120.0000 mg | ORAL_CAPSULE | Freq: Every day | ORAL | Status: DC
  Administered 2020-12-17 – 2020-12-21 (×5): 120 mg via ORAL
  Filled 2020-12-17 (×5): qty 1

## 2020-12-17 MED ORDER — ACETAMINOPHEN 325 MG PO TABS
650.0000 mg | ORAL_TABLET | Freq: Once | ORAL | Status: AC
  Administered 2020-12-17: 650 mg via ORAL
  Filled 2020-12-17: qty 2

## 2020-12-17 MED ORDER — PRAVASTATIN SODIUM 10 MG PO TABS
5.0000 mg | ORAL_TABLET | Freq: Every day | ORAL | Status: DC
  Administered 2020-12-18 – 2020-12-20 (×3): 5 mg via ORAL
  Filled 2020-12-17 (×3): qty 1

## 2020-12-17 MED ORDER — INSULIN ASPART 100 UNIT/ML ~~LOC~~ SOLN
0.0000 [IU] | Freq: Three times a day (TID) | SUBCUTANEOUS | Status: DC
  Administered 2020-12-19: 2 [IU] via SUBCUTANEOUS
  Administered 2020-12-19 – 2020-12-21 (×5): 1 [IU] via SUBCUTANEOUS

## 2020-12-17 MED ORDER — SODIUM CHLORIDE 0.9 % IV SOLN
200.0000 mg | Freq: Once | INTRAVENOUS | Status: AC
  Administered 2020-12-17: 200 mg via INTRAVENOUS
  Filled 2020-12-17: qty 200

## 2020-12-17 MED ORDER — HYDROCOD POLST-CPM POLST ER 10-8 MG/5ML PO SUER: 5.0000 mL | Freq: Two times a day (BID) | ORAL | Status: DC | PRN

## 2020-12-17 MED ORDER — GUAIFENESIN-DM 100-10 MG/5ML PO SYRP
10.0000 mL | ORAL_SOLUTION | ORAL | Status: DC | PRN
  Administered 2020-12-18 – 2020-12-20 (×3): 10 mL via ORAL
  Filled 2020-12-17 (×3): qty 10

## 2020-12-17 MED ORDER — LOSARTAN POTASSIUM 50 MG PO TABS
50.0000 mg | ORAL_TABLET | Freq: Every day | ORAL | Status: DC
  Administered 2020-12-17 – 2020-12-21 (×5): 50 mg via ORAL
  Filled 2020-12-17 (×5): qty 1

## 2020-12-17 MED ORDER — FUROSEMIDE 20 MG PO TABS: 20.0000 mg | ORAL_TABLET | Freq: Two times a day (BID) | ORAL | Status: DC

## 2020-12-17 MED ORDER — ONDANSETRON HCL 4 MG PO TABS: 4.0000 mg | ORAL_TABLET | Freq: Four times a day (QID) | ORAL | Status: DC | PRN

## 2020-12-17 MED ORDER — PANTOPRAZOLE SODIUM 40 MG PO TBEC
40.0000 mg | DELAYED_RELEASE_TABLET | Freq: Every day | ORAL | Status: DC
  Administered 2020-12-18 – 2020-12-21 (×4): 40 mg via ORAL
  Filled 2020-12-17 (×4): qty 1

## 2020-12-17 MED ORDER — GABAPENTIN 100 MG PO CAPS
100.0000 mg | ORAL_CAPSULE | Freq: Every day | ORAL | Status: DC | PRN
  Filled 2020-12-17: qty 1

## 2020-12-17 MED ORDER — HEPARIN SODIUM (PORCINE) 5000 UNIT/ML IJ SOLN
5000.0000 [IU] | Freq: Three times a day (TID) | INTRAMUSCULAR | Status: DC
  Administered 2020-12-17 – 2020-12-19 (×5): 5000 [IU] via SUBCUTANEOUS
  Filled 2020-12-17 (×5): qty 1

## 2020-12-17 MED ORDER — ONDANSETRON HCL 4 MG/2ML IJ SOLN: 4.0000 mg | Freq: Four times a day (QID) | INTRAMUSCULAR | Status: DC | PRN

## 2020-12-17 MED ORDER — ASPIRIN EC 81 MG PO TBEC
81.0000 mg | DELAYED_RELEASE_TABLET | Freq: Every day | ORAL | Status: DC
  Administered 2020-12-18 – 2020-12-21 (×4): 81 mg via ORAL
  Filled 2020-12-17 (×4): qty 1

## 2020-12-17 MED ORDER — SODIUM CHLORIDE 0.9 % IV SOLN
100.0000 mg | Freq: Every day | INTRAVENOUS | Status: AC
  Administered 2020-12-18 – 2020-12-21 (×4): 100 mg via INTRAVENOUS
  Filled 2020-12-17 (×5): qty 20

## 2020-12-17 NOTE — ED Provider Notes (Signed)
[facility] EMERGENCY DEPARTMENT Provider Note   CSN: [id number] Arrival date & time: 12/17/20  1157    History Chief Complaint  Patient presents with  . Weakness  . Shortness of Breath    Julie [name] is a 65 y.o. female with past medical history significant for CHF, asthma, hypertension, ESRD, Monday Wednesday Friday, last 1 yesterday who presents for evaluation of weakness and shortness of breath.  Started 4 days ago.  Has pleuritic chest pain.  Has had some intermittent hemoptysis over the last 2 weeks.  Was initially seen by PCP, taken off of her anticoagulant.  Had plain CT chest with did not show any abnormality.  Per patient has not had a CTA of her chest.  On arrival she is febrile, denies any fever at home.  Has had a cough productive of clear sputum.  She is not vaccine against COVID.  Did have a Covid exposure approximately 2 to 3 weeks ago.  Denies headache, lightheadedness, dizziness, exertional chest pain, abdominal pain, diarrhea, dysuria, unilateral leg swelling, redness or warmth.  States she went to dialysis yesterday and had a full session.  She does not feel like she is fluid overloaded.  She feels generally weak and fatigued.  She denies any focal unilateral weakness.  Has had decreased appetite.  Denies additional aggravating or alleviating factors.  History obtained from patient and past medical records.  No interpreter used.  HPI     Past Medical History:  Diagnosis Date  . Asthma   . CHF (congestive heart failure) ([place])   . Gout   . Hypertension   . Neuropathy   . Plantar fasciitis   . Renal disorder     Patient Active Problem List   Diagnosis Date Noted  . Stiffness of joint, not elsewhere classified, pelvic region and thigh 06/01/2014  . Lumbago 06/01/2014  . Muscle weakness (generalized) 06/01/2014  . Abnormality of gait 06/01/2014  . Spinal stenosis of lumbar region 07/16/2013  . LBBB (left bundle branch block) 01/15/2013  . HTN  (hypertension) 01/15/2013  . Diastolic CHF, chronic ([place]) 01/15/2013    Past Surgical History:  Procedure Laterality Date  . ABDOMINAL HYSTERECTOMY       OB History    Gravida  6   Para  4   Term  4   Preterm      AB  2   Living  4     SAB  2   IAB      Ectopic      Multiple      Live Births              Family History  Problem Relation Age of Onset  . Stroke Mother     Social History   Tobacco Use  . Smoking status: Former Smoker    Packs/day: 1.00    Years: 38.00    Pack years: 38.00    Types: Cigarettes    Quit date: 08/18/2004    Years since quitting: 16.3  . Smokeless tobacco: Never Used  Substance Use Topics  . Alcohol use: No  . Drug use: No    Home Medications Prior to Admission medications   Medication Sig Start Date End Date Taking? Authorizing Provider  acetaminophen (TYLENOL) 500 MG tablet Take 500-1,000 mg by mouth daily as needed for pain.    [provider]  albuterol (PROVENTIL HFA;VENTOLIN HFA) 108 (90 BASE) MCG/ACT inhaler Inhale 2 puffs into the lungs every 4 (four)  hours as needed for wheezing. 01/05/13   Evelina Bucy, MD  amLODipine (NORVASC) 10 MG tablet Take 10 mg by mouth daily.      [provider]  aspirin EC 81 MG tablet Take 81 mg by mouth daily.    [provider]  atorvastatin (LIPITOR) 10 MG tablet Take 10 mg by mouth at bedtime.    [provider]  clotrimazole (LOTRIMIN) 1 % cream Apply to affected area 2 times daily 09/14/13   Larene Pickett, PA-C  colchicine 0.6 MG tablet Take 0.6 mg by mouth daily.    [provider]  Cyanocobalamin (B-12 SUPER STRENGTH SL) Place 1 tablet under the tongue daily.    [provider]  fluticasone (FLONASE) 50 MCG/ACT nasal spray Place 2 sprays into the nose daily.    [provider]  furosemide (LASIX) 20 MG tablet Take 20 mg by mouth 2 (two) times daily.    [provider]  gabapentin (NEURONTIN) 300 MG  capsule Take 300 mg by mouth 2 (two) times daily.     [provider]  glipiZIDE (GLUCOTROL XL) 5 MG 24 hr tablet Take 5 mg by mouth daily.    [provider]  HYDROcodone-acetaminophen (NORCO/VICODIN) 5-325 MG per tablet Take 1 tablet by mouth every 4 (four) hours as needed for pain. Reported on 12/15/2015    [provider]  lisinopril (PRINIVIL,ZESTRIL) 10 MG tablet Take 20 mg by mouth daily.    [provider]  metoprolol succinate (TOPROL-XL) 25 MG 24 hr tablet Take 25 mg by mouth daily.    [provider]  nitroGLYCERIN (NITROSTAT) 0.4 MG SL tablet Place 0.4 mg under the tongue every 5 (five) minutes as needed for chest pain (1 tablet under tongue every 5 minutes as needed for chest pain).    Antony Salmon, MD  omeprazole (PRILOSEC) 20 MG capsule Take 20 mg by mouth 2 (two) times daily before a meal. 2 capsules, 2x/day    [provider]  sulfamethoxazole-trimethoprim (SEPTRA DS) 800-160 MG per tablet Take 1 tablet by mouth every 12 (twelve) hours. 09/14/13   Larene Pickett, PA-C  traMADol-acetaminophen (ULTRACET) 37.5-325 MG tablet TAKE ONE TABLET BY MOUTH EVERY 4 HOURS AS NEEDED 01/01/17   Carole Civil, MD    Allergies    Patient has no known allergies.  Review of Systems   Review of Systems  Constitutional: Positive for activity change, appetite change and fatigue.  HENT: Negative.   Respiratory: Positive for cough and shortness of breath.   Cardiovascular: Positive for chest pain (Pleuritic).  Gastrointestinal: Negative.   Genitourinary: Negative.   Musculoskeletal: Positive for myalgias.  Skin: Negative.   Neurological: Positive for weakness (Generalized). Negative for dizziness, facial asymmetry, light-headedness, numbness and headaches.  All other systems reviewed and are negative.   Physical Exam Updated Vital Signs BP 138/65 (BP Location: Right Arm)   Pulse 70   Temp 99.9 F (37.7 C) (Oral)   Resp 20   Ht 5'  5" (1.651 m)   Wt 122.5 kg   SpO2 100%   BMI 44.93 kg/m   Physical Exam Vitals and nursing note reviewed.  Constitutional:      General: She is not in acute distress.    Appearance: She is well-developed and well-nourished. She is obese. She is ill-appearing. She is not toxic-appearing.  HENT:     Head: Normocephalic and atraumatic.     Mouth/Throat:     Pharynx: Oropharynx is clear.  Eyes:     Pupils: Pupils are equal, round, and reactive to light.  Cardiovascular:     Rate and Rhythm: Normal rate.     Pulses: Normal pulses and intact distal pulses.     Heart sounds: Normal heart sounds.  Pulmonary:     Effort: Pulmonary effort is normal. No respiratory distress.     Breath sounds: Normal breath sounds.     Comments: Mild, coarse lung sounds. Abdominal:     General: Bowel sounds are normal. There is no distension.     Palpations: Abdomen is soft.     Comments: Soft, nontender without rebound or guarding  Musculoskeletal:        General: Normal range of motion.     Cervical back: Normal range of motion.     Comments: No bony tenderness.  Moves all 4 extremities at difficulty.  Trace pitting edema to lower extremities.  Nontender posterior calves bilaterally  Fistula with palpable thrill to left upper extremity  Skin:    General: Skin is warm and dry.     Capillary Refill: Capillary refill takes less than 2 seconds.     Comments: No rashes, erythema or warmth  Neurological:     General: No focal deficit present.     Mental Status: She is alert and oriented to person, place, and time.     Comments: Cranial nerves II through XII grossly intact.  Psychiatric:        Mood and Affect: Mood and affect normal.    ED Results / Procedures / Treatments   Labs (all labs ordered are listed, but only abnormal results are displayed) Labs Reviewed  BASIC METABOLIC PANEL - Abnormal; Notable for the following components:      Result Value   Potassium 3.2 (*)    Chloride 97 (*)     Glucose, Bld 120 (*)    Creatinine, Ser 8.69 (*)    Calcium 7.9 (*)    GFR, Estimated 5 (*)    All other components within normal limits  CBC - Abnormal; Notable for the following components:   WBC 3.9 (*)    RBC 2.57 (*)    Hemoglobin 7.5 (*)    HCT 23.3 (*)    All other components within normal limits  D-DIMER, QUANTITATIVE (NOT AT [facility]) - Abnormal; Notable for the following components:   D-Dimer, Quant 0.99 (*)    All other components within normal limits  CBG MONITORING, ED - Abnormal; Notable for the following components:   Glucose-Capillary 109 (*)    All other components within normal limits  POC SARS CORONAVIRUS 2 AG -  ED - Abnormal; Notable for the following components:   SARS Coronavirus 2 Ag POSITIVE (*)    All other components within normal limits  TROPONIN I (HIGH SENSITIVITY) - Abnormal; Notable for the following components:   Troponin I (High Sensitivity) 61 (*)    All other components within normal limits  TROPONIN I (HIGH SENSITIVITY) - Abnormal; Notable for the following components:   Troponin I (High Sensitivity) 63 (*)    All other components within normal limits  CULTURE, BLOOD (ROUTINE X 2)  CULTURE, BLOOD (ROUTINE X 2)  URINALYSIS, ROUTINE W REFLEX MICROSCOPIC  LACTIC ACID, PLASMA  LACTIC ACID, PLASMA  PROCALCITONIN  LACTATE DEHYDROGENASE  FERRITIN  TRIGLYCERIDES  FIBRINOGEN  C-REACTIVE PROTEIN    EKG EKG Interpretation  Date/Time:  Saturday December 17 2020 12:13:48 EST Ventricular Rate:  94 PR Interval:  198 QRS Duration:  140 QT Interval:  412 QTC Calculation: 515 R Axis:   61 Text Interpretation: Normal sinus rhythm Non-specific intra-ventricular conduction block Cannot rule out Septal infarct , age undetermined No significant change since last tracing Confirmed by Blanchie Dessert [id number]) on 12/17/2020 2:44:22 PM   Radiology DG Chest 2 View  Result Date: 12/17/2020 CLINICAL DATA:  Shortness of breath, increased shortness of breath and  weakness for 4 days. EXAM: CHEST - 2 VIEW COMPARISON:  May 19, 2013 evaluation. FINDINGS: Trachea midline. Cardiomediastinal contours with stable cardiac enlargement. Central pulmonary vascular engorgement. No frank edema. No lobar consolidation. No pleural effusion. Study limited by body habitus. On limited assessment no acute skeletal process. IMPRESSION: Cardiomegaly with central pulmonary vascular engorgement. No acute cardiopulmonary process. Electronically Signed   By: Zetta Bills M.D.   On: 12/17/2020 12:42    Procedures .Critical Care Performed by: Nettie Elm, PA-C Authorized by: Nettie Elm, PA-C   Critical care provider statement:    Critical care time (minutes):  45   Critical care was necessary to treat or prevent imminent or life-threatening deterioration of the following conditions:  Respiratory failure   Critical care was time spent personally by me on the following activities:  Discussions with consultants, evaluation of patient's response to treatment, examination of patient, ordering and performing treatments and interventions, ordering and review of laboratory studies, ordering and review of radiographic studies, pulse oximetry, re-evaluation of patient's condition, obtaining history from patient or surrogate and review of old charts     Medications Ordered in ED Medications  dexamethasone (DECADRON) injection 8 mg (has no administration in time range)  acetaminophen (TYLENOL) tablet 650 mg (650 mg Oral Given 12/17/20 1400)    ED Course  I have reviewed the triage vital signs and the nursing notes.  Pertinent labs & imaging results that were available during my care of the patient were reviewed by me and considered in my medical decision making (see chart for details).  65 year old presents for evaluation of shortness of breath and cough.  On arrival she is febrile however not tachycardic or tachypneic.  She is ESRD, dialysis session yesterday.  Does have  some mild trace lower extremity edema however does not appear grossly fluid overloaded.  Her heart is clear.  She does have some mild coarse lung sounds.  Abdomen soft, nontender.  She is neurovascularly intact.  Work-up started from triage  Labs and imaging personally reviewed and interpreted:  CBC with white count at 3.9, Hgb 7.5 similar to baseline labs by PCP Metabolic panel with hypokalemia at 3.2, glucose 120, creatinine 8.69 similar to prior Trop 61 no prior to compare DG chest with cardiomegaly, stable from prior imaging COVID positive  Patient ambulated into the mid 80s with significant dyspnea and tachypnea.  Will need admission.  Given she has had some mopped assist, pleuritic chest pain, elevated troponin will obtain CTA chest to rule out PE.  Her Covid test is pending. Given febrile, leukopenia feel this is likely.  Patient reassessed. On 2 L via nasal cannula currently. Clinically I do not feel her hypoxia is due to her fluid overload. She has no current chest pain. Her D-dimer is elevated 0.99, given this, her hypoxia and hemoptysis will obtain CTA chest to rule out PE. She will need to be admitted for acute hypoxic respiratory failure due to likely Covid pneumonia. Started on Decadron and remdesivir performed. Defervesced with Tylenol. Discussed plan with admission to hospital patient. She is agreeable to this. Does  have some mild hypokalemia however given dialysis patient will hold on supplementing at this time.  Care transferred to [facility], PA-C who will follow up on CTA chest and admit.  Patient discussed with attending Dr. Maryan Rued who agrees with above treatment, plan and disposition.     MDM Rules/Calculators/A&P                          Zinna Saulsbury was evaluated in Emergency Department on 12/17/2020 for the symptoms described in the history of present illness. She was evaluated in the context of the global COVID-19 pandemic, which necessitated consideration that the  patient might be at risk for infection with the SARS-CoV-2 virus that causes COVID-19. Institutional protocols and algorithms that pertain to the evaluation of patients at risk for COVID-19 are in a state of rapid change based on information released by regulatory bodies including the CDC and federal and state organizations. These policies and algorithms were followed during the patient's care in the ED. Final Clinical Impression(s) / ED Diagnoses Final diagnoses:  Acute respiratory failure with hypoxia ([place])  Pneumonia due to COVID-19 virus  Hypokalemia  Weakness  Elevated troponin  ESRD (end stage renal disease) ([place])  Anemia due to chronic kidney disease, on chronic dialysis [facility])    Rx / DC Orders ED Discharge Orders    None       Kyshaun Barnette A, PA-C 12/17/20 1606    Blanchie Dessert, MD 12/18/20 2052

## 2020-12-17 NOTE — Progress Notes (Signed)
Patient received to the unit. Patient is alert and oriented x4. Iv in place, vital signs are stable. Skin assessment done with another nurse. No skin breakdown noted on examination. Given instructions about call bell and phone. Bed in low position and call bell in reach.

## 2020-12-17 NOTE — ED Provider Notes (Signed)
Received care from [facility].  Please see her note for full HPI.  In short, 65 year old female with history of CHF, asthma, pretension, ESRD presents to the ER with complaints of weakness and shortness of breath.  She has pleuritic chest pain.  She was Covid positive PR, hypoxic on arrival, requiring supplemental O2.  Signout received pending CTA to evaluate for PE.  Is not felt clinically that her hypoxia was due to fluid overload.  She had no chest pain.  I personally reviewed her CTA which did not show a PE.  Consulted family medicine hospitalist team who will admit the patient for further evaluation and treatment.  Increased O2 requirements stable for admission.  Physical Exam  BP (!) 144/80   Pulse 68   Temp 99.9 F (37.7 C) (Oral)   Resp (!) 23   Ht '5\' 5"'$  (1.651 m)   Wt 122.5 kg   SpO2 100%   BMI 44.93 kg/m   Physical Exam Vitals and nursing note reviewed.  Constitutional:      General: She is not in acute distress.    Appearance: She is well-developed and well-nourished.  HENT:     Head: Normocephalic and atraumatic.  Eyes:     Conjunctiva/sclera: Conjunctivae normal.  Cardiovascular:     Rate and Rhythm: Normal rate and regular rhythm.     Heart sounds: No murmur heard.   Pulmonary:     Effort: Pulmonary effort is normal. No respiratory distress.     Breath sounds: Normal breath sounds.     Comments: Coarse breath sounds throughout Abdominal:     Palpations: Abdomen is soft.     Tenderness: There is no abdominal tenderness.  Musculoskeletal:        General: No edema.     Cervical back: Neck supple.     Right lower leg: No tenderness. No edema.     Left lower leg: No tenderness. No edema.     Comments: Left fistula with palpable thrill, no erythema, warmth, tenderness  Skin:    General: Skin is warm and dry.  Neurological:     Mental Status: She is alert.  Psychiatric:        Mood and Affect: Mood and affect normal.     ED Course/Procedures      Procedures  MDM        Garald Balding, PA-C 12/17/20 1847    Blanchie Dessert, MD 12/18/20 2052

## 2020-12-17 NOTE — ED Triage Notes (Signed)
Pt reports generalized weakness and SOB x 4 days.  No arm drift.

## 2020-12-17 NOTE — ED Notes (Signed)
Pt's walking Sa02 went down to 88% on RA

## 2020-12-17 NOTE — H&P (Addendum)
[facility] Admission History and Physical Service Pager: [phone]  Patient name: Julie [name] Medical record number: [id number] Date of birth: 1956-10-02 Age: 65 y.o. Gender: female  Primary Care Provider: Patient, No Pcp Per Consultants: none Code Status: Full Preferred Emergency Contact: Julie [name] [phone]  Chief Complaint: dyspnea and weakness   Assessment and Plan: Julie [name] is a 65 y.o. female presenting with dyspnea and weakness . PMH is significant for CHF, asthma, HTN, Type 2 DM, ESRD on MWF schedule, gout and recent diagnosis of atrial fibrillation.    Acute Hypoxic Respiratory Failure  COVID positive  Patient endorsing dyspnea for a few days that has progressively worsened. She presented febrile and was noted to be hypoxic in the setting of incidental COVID positive finding. Patient currently unvaccinated. In the ED, she was  placed on 2 L O2 after she was noted to have desaturations to 88% on room air. at baseline she does not require any oxygen supplementation. Labs significant for ferritin 3295, procalcitonin 0.50, Hgb 7.5, d-dimer 0.99, CRP 1.4, WBC 3.9 and troponin 63. CXR showed cardiomegaly without acute cardiopulmonary process. Dyspnea most likely due to COVID. Also on the differential is CHF given history but volume status not consistent with this. CTA demonstrated no evidence of PE making it less likely on the differential. However patient noted to have left calf tenderness, will plan for doppler US. CXR did not demonstrate possibly infectious etiology including pneumonia making this less likely. Concern for possible cardiac etiology, troponin 63 but will continue to trend this.  All of her symptoms are consistent with COVID-19 infection.  We will move forward with a typical sepsis work-up with blood and urine cultures although we will hold off on antibiotic treatment at this time as COVID-19 infection seems most likely. -Admit to  [facility], attending Dr. Nori Riis -contact and airborne precautions in place -remdesivir 200 mg for 1 dose followed by 100 mg dose for 4 doses -decadron 8 mg -Liraglutide started on admission for mortality benefit for diabetics with Covid. -vitals per routine -trend CRP, ferritin, D-dimer -f/u UA -f/u blood cultures -f/u urine cultures -f/u repeat lactic acid  -f/u am CBC  -wean O2 as tolerated with goals sats >92% -pending doppler US -up with assistance  -encourage COVID vaccination after recovery   Atrial fibrillation Per chart review, patient diagnosed with atrial fibrillation with RVR on last admission about a month ago. HR 95 on admission. Home medications include diltiazem 120 mg and eliquis 5 mg daily that was started at this time. -hold home eliquis.  She was instructed to hold her Eliquis when she spoke with her PCP recently regarding hemoptysis. -continue home diltiazem   Incidental cardiac pathology CTA chest noted "Left apical mural thinning may reflect sequela of previous infarct."  Julie [name] denies any known history of heart attack.  We will follow up with additional imaging with a complete echocardiogram for further evaluation of this defect.  I am concerned this may represent a weakened myocardium. -Follow-up echo  HTN On admission normotensive at 119/72 but has had multiple hypertensive episodes. Home medications include hydralazine 25 mg tid, losartan 50 mg bid and procardia 90 mg daily. -continue home losartan -continue home hydralazine -continue home procardia   Diastolic CHF No recent echo per chart review. On examination, does not appear to exhibit fluid overload. Home medications include lasix 80 mg on non-dialysis days (Tues, Thurs, Sat, Sun) -consider echo -consider BNP  Hypokalemia K 3.2 on admission. -replete with KCl 40  mEq  Type 2 DM No A1c per chart review. On admission, CBG 109. Unclear as to whether patient takes medication at  home. -sSSI -linagliptin 5 mg daily  -monitor CBGs  Asthma Home medications include flonase and zyrtec 10 mg daily. -albuterol prn   Gout Home medications allopurinol 100 mg daily. -hold home medication  Normocytic anemia Hgb 7.5 on admission, likely secondary to anemia of chronic disease. No known source of bleeding. Per chart review, baseline appears to be in the 7-8 range.  No known history of CAD.  For now, we will use a transfusion threshold of 7. -monitor am CBC  ESRD Cr 8.69 and GFR 5 on admission. Patient compliant with outpatient HD, on MWF schedule.  -HD per nephrology -am CMP  Hypocalcemia  Ca 7.9 on admission, unable to get correct Ca since no albumin. -am CMP  Obesity  BMI 44. -diet and exercise counseling outpatient   FEN/GI: carb modified diet  Prophylaxis: heparin   Disposition: admit to [place], attending Dr. Nori Riis  History of Present Illness:  Julie [name] is a 65 y.o. female presenting with with about a week onset of worsening dyspnea and generalized weakness. Sick contacts include daughter who tested positive for COVID. She did not get either dose of the COVID vaccine. Endorsing cough with green phlegm with specs of blood occasionally mixed in. She typically walks with a cane sometimes but states that she has been having to use it a lot more often lately. Does not use oxygen supplementation at baseline. Complaining of generalized weakness and myalgias. She denies fever at home but presents afebrile at home. Endorsing diarrhea. Denies melena, hematochezia and hematemesis.  Denies recent illness. Had a recent hospitalization about a month ago where she was admitted for new onset atrial fibrillation with RVR. She is unsure of her current home medications but reports that the discharge instructions includes her current home medications.       Review Of Systems: Per HPI with the following additions:   Review of Systems  Constitutional: Positive for fatigue and  fever. Negative for chills.  Respiratory: Positive for cough and shortness of breath.   Cardiovascular: Positive for chest pain.  Gastrointestinal: Positive for nausea. Negative for vomiting.  Neurological: Positive for weakness. Negative for light-headedness and headaches.  Psychiatric/Behavioral: Negative for confusion.     Patient Active Problem List   Diagnosis Date Noted  . Pneumonia due to COVID-19 virus 12/17/2020  . Stiffness of joint, not elsewhere classified, pelvic region and thigh 06/01/2014  . Lumbago 06/01/2014  . Muscle weakness (generalized) 06/01/2014  . Abnormality of gait 06/01/2014  . Spinal stenosis of lumbar region 07/16/2013  . LBBB (left bundle branch block) 01/15/2013  . HTN (hypertension) 01/15/2013  . Diastolic CHF, chronic ([place]) 01/15/2013    Past Medical History: Past Medical History:  Diagnosis Date  . Asthma   . CHF (congestive heart failure) ([place])   . Gout   . Hypertension   . Neuropathy   . Plantar fasciitis   . Renal disorder     Past Surgical History: Past Surgical History:  Procedure Laterality Date  . ABDOMINAL HYSTERECTOMY      Social History: Social History   Tobacco Use  . Smoking status: Former Smoker    Packs/day: 1.00    Years: 38.00    Pack years: 38.00    Types: Cigarettes    Quit date: 08/18/2004    Years since quitting: 16.3  . Smokeless tobacco: Never Used  Substance Use Topics  .  Alcohol use: No  . Drug use: No   Additional social history:  Please also refer to relevant sections of EMR.  Family History: Family History  Problem Relation Age of Onset  . Stroke Mother      Allergies and Medications: No Known Allergies No current facility-administered medications on file prior to encounter.   Current Outpatient Medications on File Prior to Encounter  Medication Sig Dispense Refill  . acetaminophen (TYLENOL) 500 MG tablet Take 500-1,000 mg by mouth daily as needed for pain.    Marland Kitchen albuterol (PROVENTIL  HFA;VENTOLIN HFA) 108 (90 BASE) MCG/ACT inhaler Inhale 2 puffs into the lungs every 4 (four) hours as needed for wheezing. 2 Inhaler 0  . amLODipine (NORVASC) 10 MG tablet Take 10 mg by mouth daily.      Marland Kitchen aspirin EC 81 MG tablet Take 81 mg by mouth daily.    Marland Kitchen atorvastatin (LIPITOR) 10 MG tablet Take 10 mg by mouth at bedtime.    . clotrimazole (LOTRIMIN) 1 % cream Apply to affected area 2 times daily 15 g 0  . colchicine 0.6 MG tablet Take 0.6 mg by mouth daily.    . Cyanocobalamin (B-12 SUPER STRENGTH SL) Place 1 tablet under the tongue daily.    . fluticasone (FLONASE) 50 MCG/ACT nasal spray Place 2 sprays into the nose daily.    . furosemide (LASIX) 20 MG tablet Take 20 mg by mouth 2 (two) times daily.    Marland Kitchen gabapentin (NEURONTIN) 300 MG capsule Take 300 mg by mouth 2 (two) times daily.     Marland Kitchen glipiZIDE (GLUCOTROL XL) 5 MG 24 hr tablet Take 5 mg by mouth daily.    Marland Kitchen HYDROcodone-acetaminophen (NORCO/VICODIN) 5-325 MG per tablet Take 1 tablet by mouth every 4 (four) hours as needed for pain. Reported on 12/15/2015    . lisinopril (PRINIVIL,ZESTRIL) 10 MG tablet Take 20 mg by mouth daily.    . metoprolol succinate (TOPROL-XL) 25 MG 24 hr tablet Take 25 mg by mouth daily.    . nitroGLYCERIN (NITROSTAT) 0.4 MG SL tablet Place 0.4 mg under the tongue every 5 (five) minutes as needed for chest pain (1 tablet under tongue every 5 minutes as needed for chest pain).    Marland Kitchen omeprazole (PRILOSEC) 20 MG capsule Take 20 mg by mouth 2 (two) times daily before a meal. 2 capsules, 2x/day    . sulfamethoxazole-trimethoprim (SEPTRA DS) 800-160 MG per tablet Take 1 tablet by mouth every 12 (twelve) hours. 20 tablet 0  . traMADol-acetaminophen (ULTRACET) 37.5-325 MG tablet TAKE ONE TABLET BY MOUTH EVERY 4 HOURS AS NEEDED 90 tablet 5   Objective: BP (!) 144/80   Pulse 68   Temp 99.9 F (37.7 C) (Oral)   Resp (!) 23   Ht '5\' 5"'$  (1.651 m)   Wt 122.5 kg   SpO2 100%   BMI 44.93 kg/m  Exam: General: Patient  sitting upright, in no acute distress. Neck: supple, without lymphadenopathy  Cardiovascular: RRR, no murmurs or gallops auscultated, no evidence of JVD, capillary reflex <2 Respiratory: lungs clear to auscultation bilaterally, no wheezing, rales or rhonchi  Gastrointestinal: soft, nontender, presence of active bowel sounds  MSK: tenderness noted along left calf, no LE edema noted bilaterally, radial and dorsalis pedis pulses strong and equal bilaterally  Derm: skin warm and dry to touch, no new rashes or lesions noted Neuro: AOx4 Psych: mood appropriate   Labs and Imaging: CBC BMET  Recent Labs  Lab 12/17/20 1227  WBC 3.9*  HGB 7.5*  HCT 23.3*  PLT 198   Recent Labs  Lab 12/17/20 1227  NA 136  K 3.2*  CL 97*  CO2 25  BUN 17  CREATININE 8.69*  GLUCOSE 120*  CALCIUM 7.9*      DG Chest 2 View  Result Date: 12/17/2020 CLINICAL DATA:  Shortness of breath, increased shortness of breath and weakness for 4 days. EXAM: CHEST - 2 VIEW COMPARISON:  May 19, 2013 evaluation. FINDINGS: Trachea midline. Cardiomediastinal contours with stable cardiac enlargement. Central pulmonary vascular engorgement. No Yamileth Hayse edema. No lobar consolidation. No pleural effusion. Study limited by body habitus. On limited assessment no acute skeletal process. IMPRESSION: Cardiomegaly with central pulmonary vascular engorgement. No acute cardiopulmonary process. Electronically Signed   By: Zetta Bills M.D.   On: 12/17/2020 12:42   CT Angio Chest PE W/Cm &/Or Wo Cm  Result Date: 12/17/2020 CLINICAL DATA:  Short of breath and weakness for 4 days EXAM: CT ANGIOGRAPHY CHEST WITH CONTRAST TECHNIQUE: Multidetector CT imaging of the chest was performed using the standard protocol during bolus administration of intravenous contrast. Multiplanar CT image reconstructions and MIPs were obtained to evaluate the vascular anatomy. CONTRAST:  181m OMNIPAQUE IOHEXOL 350 MG/ML SOLN COMPARISON:  12/17/2020 FINDINGS:  Cardiovascular: This is a technically adequate evaluation of the pulmonary vasculature. No filling defects or pulmonary emboli. Heart is enlarged without pericardial effusion. Prominent left ventricular hypertrophy, with left apical thinning likely due to prior infarct. No evidence of thoracic aortic aneurysm or dissection. Mild atherosclerosis. Mediastinum/Nodes: No enlarged mediastinal, hilar, or axillary lymph nodes. Thyroid gland, trachea, and esophagus demonstrate no significant findings. Lungs/Pleura: Dependent hypoventilatory changes are seen at the lung bases. There are minimal areas of subpleural ground-glass consolidation which may reflect early edema or infection. No effusion or pneumothorax. Central airways are patent. Upper Abdomen: No acute abnormality. Musculoskeletal: No acute or destructive bony lesion. Reconstructed images demonstrate no additional findings. Review of the MIP images confirms the above findings. IMPRESSION: 1. No evidence of pulmonary embolus. 2. Minimal areas of subpleural ground-glass consolidation which may reflect early edema or infection. 3. Cardiomegaly with left ventricular hypertrophy. Left apical mural thinning may reflect sequela of previous infarct. 4. Aortic Atherosclerosis (ICD10-I70.0). Electronically Signed   By: MRanda NgoM.D.   On: 12/17/2020 16:56    GDonney Dice DO 12/17/2020, 6:32 PM PGY-1, CLilburnIntern pager: [phone] text pages welcome  FPTS Upper-Level Resident Addendum   I have independently interviewed and examined the patient. I have discussed the above with the original author and agree with their documentation. My edits for correction/addition/clarification are in blue. Please see also any attending notes.    FMatilde HaymakerMD PGY-3, CBig BendMedicine 12/17/2020 8:57 PM  FGreen LakeService pager: [phone](text pages welcome through [facility]

## 2020-12-18 ENCOUNTER — Other Ambulatory Visit (HOSPITAL_COMMUNITY): Payer: Medicare Other

## 2020-12-18 ENCOUNTER — Inpatient Hospital Stay (HOSPITAL_COMMUNITY): Payer: Medicare Other

## 2020-12-18 DIAGNOSIS — J9601 Acute respiratory failure with hypoxia: Secondary | ICD-10-CM | POA: Diagnosis not present

## 2020-12-18 DIAGNOSIS — U071 COVID-19: Principal | ICD-10-CM

## 2020-12-18 DIAGNOSIS — J1282 Pneumonia due to coronavirus disease 2019: Secondary | ICD-10-CM

## 2020-12-18 DIAGNOSIS — R778 Other specified abnormalities of plasma proteins: Secondary | ICD-10-CM | POA: Diagnosis not present

## 2020-12-18 DIAGNOSIS — M79605 Pain in left leg: Secondary | ICD-10-CM | POA: Diagnosis not present

## 2020-12-18 DIAGNOSIS — R531 Weakness: Secondary | ICD-10-CM

## 2020-12-18 DIAGNOSIS — N186 End stage renal disease: Secondary | ICD-10-CM | POA: Diagnosis not present

## 2020-12-18 LAB — MAGNESIUM: Magnesium: 1.8 mg/dL (ref 1.7–2.4)

## 2020-12-18 LAB — CBC WITH DIFFERENTIAL/PLATELET
Abs Immature Granulocytes: 0.03 10*3/uL (ref 0.00–0.07)
Basophils Absolute: 0 10*3/uL (ref 0.0–0.1)
Basophils Relative: 0 %
Eosinophils Absolute: 0 10*3/uL (ref 0.0–0.5)
Eosinophils Relative: 0 %
HCT: 24.6 % — ABNORMAL LOW (ref 36.0–46.0)
Hemoglobin: 7.6 g/dL — ABNORMAL LOW (ref 12.0–15.0)
Immature Granulocytes: 1 %
Lymphocytes Relative: 9 %
Lymphs Abs: 0.5 10*3/uL — ABNORMAL LOW (ref 0.7–4.0)
MCH: 28.4 pg (ref 26.0–34.0)
MCHC: 30.9 g/dL (ref 30.0–36.0)
MCV: 91.8 fL (ref 80.0–100.0)
Monocytes Absolute: 0.1 10*3/uL (ref 0.1–1.0)
Monocytes Relative: 2 %
Neutro Abs: 4.7 10*3/uL (ref 1.7–7.7)
Neutrophils Relative %: 88 %
Platelets: 185 10*3/uL (ref 150–400)
RBC: 2.68 MIL/uL — ABNORMAL LOW (ref 3.87–5.11)
RDW: 13.8 % (ref 11.5–15.5)
WBC: 5.3 10*3/uL (ref 4.0–10.5)
nRBC: 0 % (ref 0.0–0.2)

## 2020-12-18 LAB — D-DIMER, QUANTITATIVE: D-Dimer, Quant: 1.7 ug/mL-FEU — ABNORMAL HIGH (ref 0.00–0.50)

## 2020-12-18 LAB — COMPREHENSIVE METABOLIC PANEL
ALT: 19 U/L (ref 0–44)
AST: 28 U/L (ref 15–41)
Albumin: 2.7 g/dL — ABNORMAL LOW (ref 3.5–5.0)
Alkaline Phosphatase: 35 U/L — ABNORMAL LOW (ref 38–126)
Anion gap: 11 (ref 5–15)
BUN: 22 mg/dL (ref 8–23)
CO2: 26 mmol/L (ref 22–32)
Calcium: 7.9 mg/dL — ABNORMAL LOW (ref 8.9–10.3)
Chloride: 98 mmol/L (ref 98–111)
Creatinine, Ser: 10.26 mg/dL — ABNORMAL HIGH (ref 0.44–1.00)
GFR, Estimated: 4 mL/min — ABNORMAL LOW (ref >60)
Glucose, Bld: 103 mg/dL — ABNORMAL HIGH (ref 70–99)
Potassium: 3.6 mmol/L (ref 3.5–5.1)
Sodium: 135 mmol/L (ref 135–145)
Total Bilirubin: 0.5 mg/dL (ref 0.3–1.2)
Total Protein: 5.4 g/dL — ABNORMAL LOW (ref 6.5–8.1)

## 2020-12-18 LAB — FERRITIN: Ferritin: 3158 ng/mL — ABNORMAL HIGH (ref 11–307)

## 2020-12-18 LAB — GLUCOSE, CAPILLARY
Glucose-Capillary: 80 mg/dL (ref 70–99)
Glucose-Capillary: 95 mg/dL (ref 70–99)
Glucose-Capillary: 108 mg/dL — ABNORMAL HIGH (ref 70–99)
Glucose-Capillary: 159 mg/dL — ABNORMAL HIGH (ref 70–99)

## 2020-12-18 LAB — PHOSPHORUS: Phosphorus: 4.1 mg/dL (ref 2.5–4.6)

## 2020-12-18 LAB — C-REACTIVE PROTEIN: CRP: 1.7 mg/dL — ABNORMAL HIGH (ref <1.0)

## 2020-12-18 LAB — HIV ANTIBODY (ROUTINE TESTING W REFLEX): HIV Screen 4th Generation wRfx: NONREACTIVE

## 2020-12-18 MED ORDER — CHLORHEXIDINE GLUCONATE CLOTH 2 % EX PADS
6.0000 | MEDICATED_PAD | Freq: Every day | CUTANEOUS | Status: DC
  Administered 2020-12-19 – 2020-12-21 (×3): 6 via TOPICAL

## 2020-12-18 MED ORDER — ACETAMINOPHEN 325 MG PO TABS
325.0000 mg | ORAL_TABLET | Freq: Once | ORAL | Status: AC
  Administered 2020-12-18: 325 mg via ORAL
  Filled 2020-12-18: qty 1

## 2020-12-18 MED ORDER — DEXAMETHASONE SODIUM PHOSPHATE 10 MG/ML IJ SOLN
6.0000 mg | INTRAMUSCULAR | Status: DC
  Administered 2020-12-18 – 2020-12-20 (×3): 6 mg via INTRAVENOUS
  Filled 2020-12-18 (×4): qty 1

## 2020-12-18 NOTE — Progress Notes (Signed)
Patient was feeling chills and rigors. Rectal temp was 100.8. Notified on call MD and given tylenol oral. Re check rectal temp was 100.5. patient feeling comfort and try to sleep. Will continue monitor.

## 2020-12-18 NOTE — Progress Notes (Signed)
Family Medicine Teaching Service Daily Progress Note Intern Pager: [phone]  Patient name: Julie [name] Medical record number: [id number] Date of birth: 10-Jan-1956 Age: 65 y.o. Gender: female  Primary Care Provider: Patient, No Pcp Per Consultants: Nephrology Code Status: Full  Pt Overview and Major Events to Date:  Mallarie Glogowski is a 65 y.o. female presenting with dyspnea and weakness . PMH is significant for CHF, asthma, HTN, Type 2 DM, ESRD on MWF schedule, gout and recent diagnosis of atrial fibrillation.    Acute Hypoxic Respiratory Failure  COVID positive  Remains on 2 L supplemental oxygen and satting at 98%.  Reports breathing is a little better but still fatigued. Received Remdesivir 2000 gm x1 and Decadron 8 mg x1.  Bld and urine cultures pending.  LE doppler negative for acute DVT.  Did show cyst like structure in popliteal area bilaterally. On exam lung CTAB.   -Continue contact/airborn precautions -Continue Remdesivir (02/05-09) -Decadron 6 mg po daily (02/06-02/16) -Vital signs per unit -f/u blood and urine culture -monitor oxygen sats and maintain>92% -CBC and BMP in am -OOB with assistance -Continue Tradjenta 5 mg daily in the setting of COVID and with Type 2 DM -f/u u/a  -f/u blood and urine cultures -encourage COVID vaccine when appropriate -Incentive spirometry q2h while awake    Atrial fibrillation New onset 01/22.  Treated with Cardizem 120 mg  and Metoprolol 25 mg daily.  Not on Anticoagulation due to hemoptysis. Currently in SR on monitor. She reports no hemoptysis since last Monday and has had no Eliquis since then.  No obvious signs of bleeding. -Continue Cardizem -Keep off Eliquis until 02/08 then restart. -Continue cardiac monitoring  HTN Normotensive. SBP 110's. Home medications include Hydralazine 25 mg , Losartan 50 mg, and Procardia 90 mg.   -Continue current medications -Monitor BP -BMP in am  Diastolic CHF No recent ECHO.  Does not appear  fluid overloaded on exam.  HD on Friday.  Takes Lasix on off dialysis days. BNP 347 on admission -consider repeat ECHO outpatient  Hypokalemia Resolved  Type 2 DM  No A1c per chart review. On admission, CBG 109. Unclear as to whether patient takes medication at home. -sSSI -linagliptin 5 mg daily  -monitor CBGs  Asthma Continue home medication  Gout Hold allopurinol 100 mg  Normocytic anemia Stable.  Hbg 7.6 today.  Threshold <7. No obvious signs of bleeding. -CBC in am  ESRD Cr 10.26 today.  Increased from admission. HD M/W/F. Last dialysis Friday -Nephro consulted, appreciate recs -BMP in am  Hypocalcemia  Stable -BMP in am  Elevated BMI 44 Encourage diet and exercise Mat benefit form Medical Weight loss clinic referral outpatient  FEN/GI: carb modified diet  Prophylaxis: heparin    Status is: Inpatient  Remains inpatient appropriate because:Inpatient level of care appropriate due to severity of illness   Dispo: The patient is from: Home              Anticipated d/c is to: Home              Anticipated d/c date is: 2 days              Patient currently is not medically stable to d/c.   Difficult to place patient No   Subjective:  NO acute events overnight.  Reports breathing is a little better, Continues to have pain in left leg.  Now getting dopplers completed.  Hs not had any hemoptysis or Eliquis since last Monday.  Still feeling pretty  weak from [place].  Objective: Temp:  [98 F (36.7 C)-101.1 F (38.4 C)] 98.5 F (36.9 C) (02/06 0805) Pulse Rate:  [65-95] 77 (02/06 0805) Resp:  [13-32] 16 (02/06 0805) BP: (110-154)/(65-128) 117/74 (02/06 0805) SpO2:  [91 %-100 %] 98 % (02/06 0805) Weight:  [117.5 kg-122.5 kg] 117.5 kg (02/05 2119)  Physical Exam:  General: 65 y.o. female in NAD Cardio: RRR no m/r/g Lungs: diminished breath sounds at bases, no crackles or wheezing appreciated, IWOB on 2 L oxygen Abdomen: Soft, non-tender to palpation,  non-distended, positive bowel sounds Skin: warm and dry Extremities: No edema  Laboratory: Recent Labs  Lab 12/17/20 1227 12/18/20 0344  WBC 3.9* 5.3  HGB 7.5* 7.6*  HCT 23.3* 24.6*  PLT 198 185   Recent Labs  Lab 12/17/20 1227 12/18/20 0344  NA 136 135  K 3.2* 3.6  CL 97* 98  CO2 25 26  BUN 17 22  CREATININE 8.69* 10.26*  CALCIUM 7.9* 7.9*  PROT  --  5.4*  BILITOT  --  0.5  ALKPHOS  --  35*  ALT  --  19  AST  --  28  GLUCOSE 120* 103*      Imaging/Diagnostic Tests: CT Angio Chest PE W/Cm &/Or Wo Cm  Result Date: 12/17/2020 CLINICAL DATA:  Short of breath and weakness for 4 days EXAM: CT ANGIOGRAPHY CHEST WITH CONTRAST TECHNIQUE: Multidetector CT imaging of the chest was performed using the standard protocol during bolus administration of intravenous contrast. Multiplanar CT image reconstructions and MIPs were obtained to evaluate the vascular anatomy. CONTRAST:  141m OMNIPAQUE IOHEXOL 350 MG/ML SOLN COMPARISON:  12/17/2020 FINDINGS: Cardiovascular: This is a technically adequate evaluation of the pulmonary vasculature. No filling defects or pulmonary emboli. Heart is enlarged without pericardial effusion. Prominent left ventricular hypertrophy, with left apical thinning likely due to prior infarct. No evidence of thoracic aortic aneurysm or dissection. Mild atherosclerosis. Mediastinum/Nodes: No enlarged mediastinal, hilar, or axillary lymph nodes. Thyroid gland, trachea, and esophagus demonstrate no significant findings. Lungs/Pleura: Dependent hypoventilatory changes are seen at the lung bases. There are minimal areas of subpleural ground-glass consolidation which may reflect early edema or infection. No effusion or pneumothorax. Central airways are patent. Upper Abdomen: No acute abnormality. Musculoskeletal: No acute or destructive bony lesion. Reconstructed images demonstrate no additional findings. Review of the MIP images confirms the above findings. IMPRESSION: 1. No  evidence of pulmonary embolus. 2. Minimal areas of subpleural ground-glass consolidation which may reflect early edema or infection. 3. Cardiomegaly with left ventricular hypertrophy. Left apical mural thinning may reflect sequela of previous infarct. 4. Aortic Atherosclerosis (ICD10-I70.0). Electronically Signed   By: MRanda NgoM.D.   On: 12/17/2020 16:56   VAS [place]OWER EXTREMITY VENOUS (DVT)  Result Date: 12/18/2020  Lower Venous DVT Study Indications: Covid, LT thigh pain.  Limitations: Body habitus and depth and size of vessels. Comparison Study: No prior studies. Performing Technologist: RDarlin CocoRDMS  Examination Guidelines: A complete evaluation includes B-mode imaging, spectral Doppler, color Doppler, and power Doppler as needed of all accessible portions of each vessel. Bilateral testing is considered an integral part of a complete examination. Limited examinations for reoccurring indications may be performed as noted. The reflux portion of the exam is performed with the patient in reverse Trendelenburg.  +---------+---------------+---------+-----------+----------+-------------------+ RIGHT    CompressibilityPhasicitySpontaneityPropertiesThrombus Aging      +---------+---------------+---------+-----------+----------+-------------------+ CFV      Full           Yes  Yes                                      +---------+---------------+---------+-----------+----------+-------------------+ SFJ      Full                                                             +---------+---------------+---------+-----------+----------+-------------------+ FV Prox  Full                                                             +---------+---------------+---------+-----------+----------+-------------------+ FV Mid                  Yes      Yes                                      +---------+---------------+---------+-----------+----------+-------------------+ FV DistalFull            Yes      Yes                                      +---------+---------------+---------+-----------+----------+-------------------+ PFV      Full                                                             +---------+---------------+---------+-----------+----------+-------------------+ POP      Full           Yes      Yes                                      +---------+---------------+---------+-----------+----------+-------------------+ PTV                                                   Not well visualized +---------+---------------+---------+-----------+----------+-------------------+ PERO                                                  Not well visualized +---------+---------------+---------+-----------+----------+-------------------+   +---------+---------------+---------+-----------+----------+-------------------+ LEFT     CompressibilityPhasicitySpontaneityPropertiesThrombus Aging      +---------+---------------+---------+-----------+----------+-------------------+ CFV      Full           Yes      Yes                                      +---------+---------------+---------+-----------+----------+-------------------+  SFJ      Full                                                             +---------+---------------+---------+-----------+----------+-------------------+ FV Prox  Full                                                             +---------+---------------+---------+-----------+----------+-------------------+ FV Mid   Full                                                             +---------+---------------+---------+-----------+----------+-------------------+ FV DistalFull                                                             +---------+---------------+---------+-----------+----------+-------------------+ PFV      Full                                                              +---------+---------------+---------+-----------+----------+-------------------+ POP      Full           Yes      Yes                                      +---------+---------------+---------+-----------+----------+-------------------+ PTV                     Yes      Yes                                      +---------+---------------+---------+-----------+----------+-------------------+ PERO                                                  Not well visualized +---------+---------------+---------+-----------+----------+-------------------+    Summary: RIGHT: - There is no evidence of deep vein thrombosis in the lower extremity. However, portions of this examination were limited- see technologist comments above.  - A cystic structure is found in the popliteal fossa.  LEFT: - There is no evidence of deep vein thrombosis in the lower extremity. However, portions of this examination were limited- see technologist comments above.  - A cystic structure is found in the popliteal fossa.  *See table(s) above for measurements and observations. Electronically signed  by Monica Martinez MD on 12/18/2020 at 2:29:15 PM.    Final     Carollee Leitz, MD 12/18/2020, 11:46 AM PGY-2, [facility] Intern pager: [phone], text pages welcome

## 2020-12-18 NOTE — Progress Notes (Signed)
FPTS Interim Progress Note:   Received page from RN that patient was experiencing rigors/chills.  Reported that she overall did not feel like she had a temperature, however was waiting for her to return from bedside commode to check rectal temp.  Prior to evaluating patient at bedside, received additional call from RN that temperature actually was elevated to 100.20F after receiving Tylenol 650 mg approximately 30 minutes prior.  Gave an additional Tylenol 325 mg.  Went to see patient at bedside afterwards.  Reports she is feeling better after receiving the Tylenol with minimal chills remaining.  Still reports intermittent ache in her left upper thigh that has been present since HD on Friday.  States she has been intermittently having chills since Wednesday at onset of illness.  Last HD on Friday, on schedule.  Blood pressure (!) 147/95, pulse 67, temperature (!) 100.8 F (38.2 C), temperature source Rectal, resp. rate 14, height '5\' 6"'$  (1.676 m), weight 117.5 kg, SpO2 98 %. GEN: Laying comfortably, no acute distress Pulmonary: No increased work of breathing, nasal cannula in place at 2L satting approximately 98% Extremities: Warm, dry.  No rash or bruising present over left thigh, nontender to palpation of this region and does not appear larger compared to right. Neuro: Alert and oriented.  Following conversation appropriately.  Speech easily understandable.  Able to move all extremities spontaneously.  A/p:  COVID-19 illness, febrile with concurrent chills: Elevated temperature likely etiology of experiencing chills.  Doubt significant electrolyte abnormality with mild hypokalemia earlier today that was subsequently replaced orally, however if not continuing to improve with temperature maintenance will obtain BMP/CBC early.  Encouraged adequate hydration as appropriate with known ESRD.  Left thigh pain: Unclear etiology.  Awaiting LE U/S Doppler tomorrow to rule out underlying DVT, reassuringly no  evidence of PE on CTA chest earlier this evening.  Could consider general myalgia 2/2 current COVID-19 illness.  No evidence of rash or injury.  Will continue Tylenol and add heating pad.  Remainder plan per H&P.  Patriciaann Clan, DO

## 2020-12-18 NOTE — Consult Note (Signed)
[facility] Renal Consultation Note    Indication for Consultation:  Management of ESRD/hemodialysis; anemia, hypertension/volume and secondary hyperparathyroidism  [id number], No Pcp Per  HPI: Julie [name] is a 65 y.o. female. 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  Past medical history significant for HTN, DM, asthma, HFpEF, pulmonary HTN, Afib, OSA on CPAP, gout and GERD.    Patient seen and examined at bedside.  Reports worsening weakness, fatigue and dyspnea over the last week.  States she has not missed any of her treatments recently, last dialysis 12/16/20.  Reports recent admission to [facility] for SOB where she is discharged on 2-3L home O2 and eliquis.  Anticoagulation was stopped by pulmonology a few days ago due to hemoptysis and has had no hemoptysis since that time. Reports significant bleeding at dialysis on Wednesday for unknown reason but no issues on Friday.  Denies current CP, SOB, n/v/d, abdominal pain, fever, chills, dizziness and fatigue. Reports she is scheduled for revision surgery on her fistula on 2/16 with Vascular surgery in [facility].   Recent admission to [facility] from 1/2-11/16/20 due to HF/COPD exacerbation likely 2/2 viral URI, acute hypoxic respiratory failure of multifactorial etiology including volume overload, URI and new onset A Fib with RVR. Received prednisone and UF removal with dialysis with noted improvement. Discharged on eliquis. Do not see where patient was discharged on home O2.  Pertinent findings since admission include COVID+, tachypnea, hypoxia requiring 2L via [place],  LDH 347, ferritin 3295, d dimer 1.7, CXR with no acute abnormalities, CT angio chest with no PE and minimal areas of subpleural ground-glass consolidation and preliminary LE duplex with no evidence of DVT. Patient has been admitted for  further evaluation and management.   Past Medical History:  Diagnosis Date  . Asthma   . CHF (congestive heart failure) ([place])   . Gout   . Hypertension   . Neuropathy   . Plantar fasciitis   . Renal disorder    Past Surgical History:  Procedure Laterality Date  . ABDOMINAL HYSTERECTOMY     Family History  Problem Relation Age of Onset  . Stroke Mother    Social History:  reports that she quit smoking about 16 years ago. Her smoking use included cigarettes. She has a 38.00 pack-year smoking history. She has never used smokeless tobacco. She reports that she does not drink alcohol and does not use drugs. No Known Allergies Prior to Admission medications   Medication Sig Start Date End Date Taking? Authorizing Provider  acetaminophen (TYLENOL) 500 MG tablet Take 500-1,000 mg by mouth daily as needed for pain.    [provider]  albuterol (PROVENTIL HFA;VENTOLIN HFA) 108 (90 BASE) MCG/ACT inhaler Inhale 2 puffs into the lungs every 4 (four) hours as needed for wheezing. 01/05/13   Evelina Bucy, MD  amLODipine (NORVASC) 10 MG tablet Take 10 mg by mouth daily.      [provider]  aspirin EC 81 MG tablet Take 81 mg by mouth daily.    [provider]  atorvastatin (LIPITOR) 10 MG tablet Take 10 mg by mouth at bedtime.    [provider]  clotrimazole (LOTRIMIN) 1 % cream Apply to affected area 2 times daily 09/14/13   Larene Pickett, PA-C  colchicine 0.6 MG tablet Take 0.6 mg by mouth daily.    [provider]  Cyanocobalamin (B-12  SUPER STRENGTH SL) Place 1 tablet under the tongue daily.    [provider]  fluticasone (FLONASE) 50 MCG/ACT nasal spray Place 2 sprays into the nose daily.    [provider]  furosemide (LASIX) 20 MG tablet Take 20 mg by mouth 2 (two) times daily.    [provider]  gabapentin (NEURONTIN) 300 MG capsule Take 300 mg by mouth 2 (two) times daily.     [provider]   glipiZIDE (GLUCOTROL XL) 5 MG 24 hr tablet Take 5 mg by mouth daily.    [provider]  HYDROcodone-acetaminophen (NORCO/VICODIN) 5-325 MG per tablet Take 1 tablet by mouth every 4 (four) hours as needed for pain. Reported on 12/15/2015    [provider]  lisinopril (PRINIVIL,ZESTRIL) 10 MG tablet Take 20 mg by mouth daily.    [provider]  metoprolol succinate (TOPROL-XL) 25 MG 24 hr tablet Take 25 mg by mouth daily.    [provider]  nitroGLYCERIN (NITROSTAT) 0.4 MG SL tablet Place 0.4 mg under the tongue every 5 (five) minutes as needed for chest pain (1 tablet under tongue every 5 minutes as needed for chest pain).    Antony Salmon, MD  omeprazole (PRILOSEC) 20 MG capsule Take 20 mg by mouth 2 (two) times daily before a meal. 2 capsules, 2x/day    [provider]  sulfamethoxazole-trimethoprim (SEPTRA DS) 800-160 MG per tablet Take 1 tablet by mouth every 12 (twelve) hours. 09/14/13   Larene Pickett, PA-C  traMADol-acetaminophen (ULTRACET) 37.5-325 MG tablet TAKE ONE TABLET BY MOUTH EVERY 4 HOURS AS NEEDED 01/01/17   Carole Civil, MD   Current Facility-Administered Medications  Medication Dose Route Frequency Provider Last Rate Last Admin  . acetaminophen (TYLENOL) tablet 650 mg  650 mg Oral Q6H PRN Matilde Haymaker, MD   650 mg at 12/17/20 2345  . albuterol (VENTOLIN HFA) 108 (90 Base) MCG/ACT inhaler 2 puff  2 puff Inhalation Q4H PRN Matilde Haymaker, MD      . aspirin EC tablet 81 mg  81 mg Oral Daily Matilde Haymaker, MD   81 mg at 12/18/20 0756  . chlorpheniramine-HYDROcodone (TUSSIONEX) 10-8 MG/5ML suspension 5 mL  5 mL Oral Q12H PRN Matilde Haymaker, MD      . dexamethasone (DECADRON) injection 8 mg  8 mg Intravenous Once Matilde Haymaker, MD      . diltiazem (CARDIZEM CD) 24 hr capsule 120 mg  120 mg Oral Daily Matilde Haymaker, MD   120 mg at 12/18/20 0756  . furosemide (LASIX) tablet 80 mg  80 mg Oral Daily Matilde Haymaker, MD   80 mg at 12/18/20 0756  .  gabapentin (NEURONTIN) capsule 100 mg  100 mg Oral Daily PRN Matilde Haymaker, MD      . guaiFENesin-dextromethorphan [facility] DM) 100-10 MG/5ML syrup 10 mL  10 mL Oral Q4H PRN Matilde Haymaker, MD      . heparin injection 5,000 Units  5,000 Units Subcutaneous Q8H Matilde Haymaker, MD   5,000 Units at 12/18/20 [id number]  . hydrALAZINE (APRESOLINE) tablet 25 mg  25 mg Oral Q8H Matilde Haymaker, MD   25 mg at 12/18/20 [id number]  . insulin aspart (novoLOG) injection 0-9 Units  0-9 Units Subcutaneous TID WC Matilde Haymaker, MD      . linagliptin [facility]) tablet 5 mg  5 mg Oral Daily Matilde Haymaker, MD   5 mg at 12/18/20 0757  . losartan (COZAAR) tablet 50 mg  50 mg Oral Daily Matilde Haymaker,  MD   50 mg at 12/18/20 0757  . NIFEdipine (PROCARDIA XL/NIFEDICAL-XL) 24 hr tablet 90 mg  90 mg Oral Daily Matilde Haymaker, MD   90 mg at 12/18/20 0757  . ondansetron (ZOFRAN) tablet 4 mg  4 mg Oral Q6H PRN Matilde Haymaker, MD       Or  . ondansetron [facility]) injection 4 mg  4 mg Intravenous Q6H PRN Matilde Haymaker, MD      . pantoprazole (PROTONIX) EC tablet 40 mg  40 mg Oral Daily Matilde Haymaker, MD   40 mg at 12/18/20 0756  . pravastatin (PRAVACHOL) tablet 5 mg  5 mg Oral q1800 Matilde Haymaker, MD      . remdesivir 100 mg in sodium chloride 0.9 % 100 mL IVPB  100 mg Intravenous Daily Matilde Haymaker, MD   Stopped at 12/18/20 0846   Labs: Basic Metabolic Panel: Recent Labs  Lab 12/17/20 1227 12/18/20 0344  NA 136 135  K 3.2* 3.6  CL 97* 98  CO2 25 26  GLUCOSE 120* 103*  BUN 17 22  CREATININE 8.69* 10.26*  CALCIUM 7.9* 7.9*  PHOS  --  4.1   Liver Function Tests: Recent Labs  Lab 12/18/20 0344  AST 28  ALT 19  ALKPHOS 35*  BILITOT 0.5  PROT 5.4*  ALBUMIN 2.7*   No results for input(s): LIPASE, AMYLASE in the last 168 hours. No results for input(s): AMMONIA in the last 168 hours. CBC: Recent Labs  Lab 12/17/20 1227 12/18/20 0344  WBC 3.9* 5.3  NEUTROABS  --  4.7  HGB 7.5* 7.6*  HCT 23.3* 24.6*  MCV 90.7 91.8  PLT 198 185    Cardiac Enzymes: No results for input(s): CKTOTAL, CKMB, CKMBINDEX, TROPONINI in the last 168 hours. CBG: Recent Labs  Lab 12/17/20 1218 12/17/20 2117 12/18/20 0808 12/18/20 1238  GLUCAP 109* 83 80 95   Iron Studies:  Recent Labs    12/18/20 0344  FERRITIN 3,158*   Studies/Results: DG Chest 2 View  Result Date: 12/17/2020 CLINICAL DATA:  Shortness of breath, increased shortness of breath and weakness for 4 days. EXAM: CHEST - 2 VIEW COMPARISON:  May 19, 2013 evaluation. FINDINGS: Trachea midline. Cardiomediastinal contours with stable cardiac enlargement. Central pulmonary vascular engorgement. No frank edema. No lobar consolidation. No pleural effusion. Study limited by body habitus. On limited assessment no acute skeletal process. IMPRESSION: Cardiomegaly with central pulmonary vascular engorgement. No acute cardiopulmonary process. Electronically Signed   By: Zetta Bills M.D.   On: 12/17/2020 12:42   CT Angio Chest PE W/Cm &/Or Wo Cm  Result Date: 12/17/2020 CLINICAL DATA:  Short of breath and weakness for 4 days EXAM: CT ANGIOGRAPHY CHEST WITH CONTRAST TECHNIQUE: Multidetector CT imaging of the chest was performed using the standard protocol during bolus administration of intravenous contrast. Multiplanar CT image reconstructions and MIPs were obtained to evaluate the vascular anatomy. CONTRAST:  14m OMNIPAQUE IOHEXOL 350 MG/ML SOLN COMPARISON:  12/17/2020 FINDINGS: Cardiovascular: This is a technically adequate evaluation of the pulmonary vasculature. No filling defects or pulmonary emboli. Heart is enlarged without pericardial effusion. Prominent left ventricular hypertrophy, with left apical thinning likely due to prior infarct. No evidence of thoracic aortic aneurysm or dissection. Mild atherosclerosis. Mediastinum/Nodes: No enlarged mediastinal, hilar, or axillary lymph nodes. Thyroid gland, trachea, and esophagus demonstrate no significant findings. Lungs/Pleura: Dependent  hypoventilatory changes are seen at the lung bases. There are minimal areas of subpleural ground-glass consolidation which may reflect early edema or infection. No effusion or  pneumothorax. Central airways are patent. Upper Abdomen: No acute abnormality. Musculoskeletal: No acute or destructive bony lesion. Reconstructed images demonstrate no additional findings. Review of the MIP images confirms the above findings. IMPRESSION: 1. No evidence of pulmonary embolus. 2. Minimal areas of subpleural ground-glass consolidation which may reflect early edema or infection. 3. Cardiomegaly with left ventricular hypertrophy. Left apical mural thinning may reflect sequela of previous infarct. 4. Aortic Atherosclerosis (ICD10-I70.0). Electronically Signed   By: Randa Ngo M.D.   On: 12/17/2020 16:56   VAS [place] LOWER EXTREMITY VENOUS (DVT)  Result Date: 12/18/2020  Lower Venous DVT Study Indications: Covid, LT thigh pain.  Limitations: Body habitus and depth and size of vessels. Comparison Study: No prior studies. Performing Technologist: Darlin Coco RDMS  Examination Guidelines: A complete evaluation includes B-mode imaging, spectral Doppler, color Doppler, and power Doppler as needed of all accessible portions of each vessel. Bilateral testing is considered an integral part of a complete examination. Limited examinations for reoccurring indications may be performed as noted. The reflux portion of the exam is performed with the patient in reverse Trendelenburg.  +---------+---------------+---------+-----------+----------+-------------------+ RIGHT    CompressibilityPhasicitySpontaneityPropertiesThrombus Aging      +---------+---------------+---------+-----------+----------+-------------------+ CFV      Full           Yes      Yes                                      +---------+---------------+---------+-----------+----------+-------------------+ SFJ      Full                                                              +---------+---------------+---------+-----------+----------+-------------------+ FV Prox  Full                                                             +---------+---------------+---------+-----------+----------+-------------------+ FV Mid                  Yes      Yes                                      +---------+---------------+---------+-----------+----------+-------------------+ FV DistalFull           Yes      Yes                                      +---------+---------------+---------+-----------+----------+-------------------+ PFV      Full                                                             +---------+---------------+---------+-----------+----------+-------------------+ POP      Full  Yes      Yes                                      +---------+---------------+---------+-----------+----------+-------------------+ PTV                                                   Not well visualized +---------+---------------+---------+-----------+----------+-------------------+ PERO                                                  Not well visualized +---------+---------------+---------+-----------+----------+-------------------+   +---------+---------------+---------+-----------+----------+-------------------+ LEFT     CompressibilityPhasicitySpontaneityPropertiesThrombus Aging      +---------+---------------+---------+-----------+----------+-------------------+ CFV      Full           Yes      Yes                                      +---------+---------------+---------+-----------+----------+-------------------+ SFJ      Full                                                             +---------+---------------+---------+-----------+----------+-------------------+ FV Prox  Full                                                              +---------+---------------+---------+-----------+----------+-------------------+ FV Mid   Full                                                             +---------+---------------+---------+-----------+----------+-------------------+ FV DistalFull                                                             +---------+---------------+---------+-----------+----------+-------------------+ PFV      Full                                                             +---------+---------------+---------+-----------+----------+-------------------+ POP      Full           Yes      Yes                                      +---------+---------------+---------+-----------+----------+-------------------+  PTV                     Yes      Yes                                      +---------+---------------+---------+-----------+----------+-------------------+ PERO                                                  Not well visualized +---------+---------------+---------+-----------+----------+-------------------+     Summary: RIGHT: - There is no evidence of deep vein thrombosis in the lower extremity. However, portions of this examination were limited- see technologist comments above.  - A cystic structure is found in the popliteal fossa.  LEFT: - There is no evidence of deep vein thrombosis in the lower extremity. However, portions of this examination were limited- see technologist comments above.  - A cystic structure is found in the popliteal fossa.  *See table(s) above for measurements and observations.    Preliminary     ROS: All others negative except those listed in HPI.  Physical Exam: Vitals:   12/18/20 0053 12/18/20 0405 12/18/20 0805 12/18/20 1235  BP: 133/72 110/68 117/74 (!) 110/57  Pulse: 86 84 77 73  Resp: '20 20 16 18  '$ Temp: (!) 100.5 F (38.1 C) 98 F (36.7 C) 98.5 F (36.9 C) 98.8 F (37.1 C)  TempSrc: Rectal Axillary Oral Oral  SpO2: 100% 98% 98% 100%   Weight:      Height:         General: WDWN female in NAD Head: NCAT sclera not icteric MMM Neck: Supple. No JVD appreciated Lungs: CTA bilaterally. No wheeze, rales or rhonchi. Breathing is unlabored. Heart: RRR. No murmur, rubs or gallops.  Abdomen: soft, nontender, +BS, no guarding, no rebound tenderness Lower extremities:no edema, ischemic changes, or open wounds  Neuro: AAOx3. Moves all extremities spontaneously. Psych:  Responds to questions appropriately with a normal affect. Dialysis Access: aneurysmal LU AVF +b/t  Dialysis Orders:  Per chart review of Consult Note from Renal PA at [facility] HD orders as follows as of 11/15/20:  (Not open today, will call to verify when open)  [facility] MWF 4hrs 350/600 edw 120kg 2K 2.5Ca bath Heparin 6500 unit bolus, 400 units per hr Hectorol 2.83mg qHD Parsabiv 2.'5mg'$  qHD   Assessment/Plan: 1.  COVID+/Acute Hypoxic respiratory failure - oxygenating well on 2L via [place].  Dyspnea improved.  On remdesivir. Per primary 2. Elevated D dimer - No PE on CTA and no DVT on preliminary LE duplex 3.  ESRD -  On HD MWF.  Orders written for HD tomorrow per regular schedule.  Will call and verify OP orders with dialysis center.  No heparin in setting of recent hemoptysis/bleeding.  Scheduled for AVF revision on 2/16.   4.  Hypertension/volume  - Blood pressure currently well controlled.  Continue home meds.  Does not appear grossly volume overloaded.  On furosemide on non dialysis days.  UF as tolerated.  5.  Anemia of CKD - Hgb 7.6. Requesting OP records. No iron due to high ferritin. Transfuse prn.  6.  Secondary Hyperparathyroidism -  Corrected Ca ok.  Will check phos.  Requesting records to verify outpatient meds, will order last known dose VDRA for now.  7.  Nutrition - Renal diet w/fluid restrictions.  8. HFpEF - per primary 9. A fib - new onset in January, not currently on anticoagulation due to hemoptysis 10. DM - per primary  Jen Mow,  PA-C [facility] 12/18/2020, 1:41 PM

## 2020-12-18 NOTE — Progress Notes (Signed)
Lower extremity venous bilateral study completed.   Please see CV Proc for preliminary results.   Hikari Tripp, RDMS  

## 2020-12-19 ENCOUNTER — Inpatient Hospital Stay (HOSPITAL_COMMUNITY): Payer: Medicare Other

## 2020-12-19 DIAGNOSIS — U071 COVID-19: Secondary | ICD-10-CM | POA: Diagnosis not present

## 2020-12-19 DIAGNOSIS — I350 Nonrheumatic aortic (valve) stenosis: Secondary | ICD-10-CM | POA: Diagnosis not present

## 2020-12-19 DIAGNOSIS — I361 Nonrheumatic tricuspid (valve) insufficiency: Secondary | ICD-10-CM

## 2020-12-19 DIAGNOSIS — N186 End stage renal disease: Secondary | ICD-10-CM | POA: Diagnosis not present

## 2020-12-19 DIAGNOSIS — R931 Abnormal findings on diagnostic imaging of heart and coronary circulation: Secondary | ICD-10-CM | POA: Diagnosis not present

## 2020-12-19 DIAGNOSIS — I340 Nonrheumatic mitral (valve) insufficiency: Secondary | ICD-10-CM | POA: Diagnosis not present

## 2020-12-19 DIAGNOSIS — J9601 Acute respiratory failure with hypoxia: Secondary | ICD-10-CM | POA: Diagnosis not present

## 2020-12-19 DIAGNOSIS — E876 Hypokalemia: Secondary | ICD-10-CM | POA: Diagnosis not present

## 2020-12-19 LAB — RENAL FUNCTION PANEL
Albumin: 2.9 g/dL — ABNORMAL LOW (ref 3.5–5.0)
Anion gap: 16 — ABNORMAL HIGH (ref 5–15)
BUN: 48 mg/dL — ABNORMAL HIGH (ref 8–23)
CO2: 21 mmol/L — ABNORMAL LOW (ref 22–32)
Calcium: 8.2 mg/dL — ABNORMAL LOW (ref 8.9–10.3)
Chloride: 95 mmol/L — ABNORMAL LOW (ref 98–111)
Creatinine, Ser: 13.36 mg/dL — ABNORMAL HIGH (ref 0.44–1.00)
GFR, Estimated: 3 mL/min — ABNORMAL LOW (ref >60)
Glucose, Bld: 138 mg/dL — ABNORMAL HIGH (ref 70–99)
Phosphorus: 5.8 mg/dL — ABNORMAL HIGH (ref 2.5–4.6)
Potassium: 4.2 mmol/L (ref 3.5–5.1)
Sodium: 132 mmol/L — ABNORMAL LOW (ref 135–145)

## 2020-12-19 LAB — GLUCOSE, CAPILLARY
Glucose-Capillary: 140 mg/dL — ABNORMAL HIGH (ref 70–99)
Glucose-Capillary: 176 mg/dL — ABNORMAL HIGH (ref 70–99)
Glucose-Capillary: 184 mg/dL — ABNORMAL HIGH (ref 70–99)

## 2020-12-19 LAB — CBC WITH DIFFERENTIAL/PLATELET
Abs Immature Granulocytes: 0 10*3/uL (ref 0.00–0.07)
Basophils Absolute: 0 10*3/uL (ref 0.0–0.1)
Basophils Relative: 0 %
Eosinophils Absolute: 0 10*3/uL (ref 0.0–0.5)
Eosinophils Relative: 0 %
HCT: 23.6 % — ABNORMAL LOW (ref 36.0–46.0)
Hemoglobin: 7.9 g/dL — ABNORMAL LOW (ref 12.0–15.0)
Lymphocytes Relative: 11 %
Lymphs Abs: 0.3 10*3/uL — ABNORMAL LOW (ref 0.7–4.0)
MCH: 29.3 pg (ref 26.0–34.0)
MCHC: 33.5 g/dL (ref 30.0–36.0)
MCV: 87.4 fL (ref 80.0–100.0)
Monocytes Absolute: 0.1 10*3/uL (ref 0.1–1.0)
Monocytes Relative: 5 %
Neutro Abs: 1.9 10*3/uL (ref 1.7–7.7)
Neutrophils Relative %: 84 %
Platelets: 222 10*3/uL (ref 150–400)
RBC: 2.7 MIL/uL — ABNORMAL LOW (ref 3.87–5.11)
RDW: 13.9 % (ref 11.5–15.5)
WBC: 2.3 10*3/uL — ABNORMAL LOW (ref 4.0–10.5)
nRBC: 0 % (ref 0.0–0.2)
nRBC: 0 /100 WBC

## 2020-12-19 LAB — URINALYSIS, ROUTINE W REFLEX MICROSCOPIC
Bilirubin Urine: NEGATIVE
Glucose, UA: 50 mg/dL — AB
Hgb urine dipstick: NEGATIVE
Ketones, ur: NEGATIVE mg/dL
Leukocytes,Ua: NEGATIVE
Nitrite: NEGATIVE
Protein, ur: >=300 mg/dL — AB
Specific Gravity, Urine: 1.035 — ABNORMAL HIGH (ref 1.005–1.030)
pH: 7 (ref 5.0–8.0)

## 2020-12-19 LAB — CBC
HCT: 22.6 % — ABNORMAL LOW (ref 36.0–46.0)
Hemoglobin: 7.5 g/dL — ABNORMAL LOW (ref 12.0–15.0)
MCH: 29.6 pg (ref 26.0–34.0)
MCHC: 33.2 g/dL (ref 30.0–36.0)
MCV: 89.3 fL (ref 80.0–100.0)
Platelets: 233 10*3/uL (ref 150–400)
RBC: 2.53 MIL/uL — ABNORMAL LOW (ref 3.87–5.11)
RDW: 14 % (ref 11.5–15.5)
WBC: 3.6 10*3/uL — ABNORMAL LOW (ref 4.0–10.5)
nRBC: 0 % (ref 0.0–0.2)

## 2020-12-19 LAB — MAGNESIUM: Magnesium: 2.1 mg/dL (ref 1.7–2.4)

## 2020-12-19 LAB — ECHOCARDIOGRAM LIMITED
AR max vel: 1.5 cm2
AV Area VTI: 1.46 cm2
AV Area mean vel: 1.56 cm2
AV Mean grad: 13 mmHg
AV Peak grad: 22.7 mmHg
Ao pk vel: 2.38 m/s
Area-P 1/2: 3.72 cm2
Height: 66 in
S' Lateral: 3.4 cm
Weight: 4144.65 oz

## 2020-12-19 LAB — C-REACTIVE PROTEIN: CRP: 2.5 mg/dL — ABNORMAL HIGH (ref <1.0)

## 2020-12-19 LAB — FERRITIN: Ferritin: 2776 ng/mL — ABNORMAL HIGH (ref 11–307)

## 2020-12-19 LAB — PHOSPHORUS: Phosphorus: 5.6 mg/dL — ABNORMAL HIGH (ref 2.5–4.6)

## 2020-12-19 LAB — D-DIMER, QUANTITATIVE: D-Dimer, Quant: 1.89 ug/mL-FEU — ABNORMAL HIGH (ref 0.00–0.50)

## 2020-12-19 MED ORDER — ACETAMINOPHEN 325 MG PO TABS
ORAL_TABLET | ORAL | Status: AC
  Administered 2020-12-19: 650 mg via ORAL
  Filled 2020-12-19: qty 2

## 2020-12-19 MED ORDER — FUROSEMIDE 80 MG PO TABS
80.0000 mg | ORAL_TABLET | ORAL | Status: DC
  Administered 2020-12-20: 80 mg via ORAL
  Filled 2020-12-19: qty 1

## 2020-12-19 MED ORDER — LIDOCAINE-PRILOCAINE 2.5-2.5 % EX CREA
1.0000 "application " | TOPICAL_CREAM | CUTANEOUS | Status: DC | PRN
  Filled 2020-12-19: qty 5

## 2020-12-19 MED ORDER — SODIUM CHLORIDE 0.9 % IV SOLN: 100.0000 mL | INTRAVENOUS | Status: DC | PRN

## 2020-12-19 MED ORDER — LIDOCAINE HCL (PF) 1 % IJ SOLN
5.0000 mL | INTRAMUSCULAR | Status: DC | PRN
  Filled 2020-12-19: qty 5

## 2020-12-19 MED ORDER — APIXABAN 5 MG PO TABS
5.0000 mg | ORAL_TABLET | Freq: Two times a day (BID) | ORAL | Status: DC
  Administered 2020-12-19 – 2020-12-21 (×5): 5 mg via ORAL
  Filled 2020-12-19 (×5): qty 1

## 2020-12-19 MED ORDER — PNEUMOCOCCAL VAC POLYVALENT 25 MCG/0.5ML IJ INJ
0.5000 mL | INJECTION | INTRAMUSCULAR | Status: DC
  Filled 2020-12-19: qty 0.5

## 2020-12-19 MED ORDER — PENTAFLUOROPROP-TETRAFLUOROETH EX AERO: 1.0000 "application " | INHALATION_SPRAY | CUTANEOUS | Status: DC | PRN

## 2020-12-19 MED ORDER — ALTEPLASE 2 MG IJ SOLR: 2.0000 mg | Freq: Once | INTRAMUSCULAR | Status: DC | PRN

## 2020-12-19 MED ORDER — HEPARIN SODIUM (PORCINE) 1000 UNIT/ML DIALYSIS
1000.0000 [IU] | INTRAMUSCULAR | Status: DC | PRN
Start: 2020-12-19 — End: 2020-12-19

## 2020-12-19 NOTE — Progress Notes (Signed)
Cardiology was curbsided regarding the abnormal cardiac findings on CTA chest.  After brief discussion, the recommendation was to follow-up with an echocardiogram and to consult cardiology if there was evidence of significant wall motion abnormalities.  Matilde Haymaker, MD

## 2020-12-19 NOTE — Progress Notes (Signed)
  Echocardiogram 2D Echocardiogram has been performed.  Julie [name] 12/19/2020, 4:42 PM

## 2020-12-19 NOTE — Progress Notes (Signed)
[facility] Progress Note  Subjective: seen in room, no c/o  Vitals:   12/19/20 0408 12/19/20 0644 12/19/20 0759 12/19/20 1203  BP: 122/69  121/79 (!) 102/52  Pulse: 70  74 77  Resp: '20  18 18  '$ Temp: 97.8 F (36.6 C)  98 F (36.7 C) 97.9 F (36.6 C)  TempSrc: Oral  Axillary Axillary  SpO2: 92% 99% 99% 96%  Weight:      Height:        Exam:   alert, nad , nasal O2  no jvd  Chest cta bilat  Cor reg no RG  Abd soft ntnd no ascites   Ext no LE edema   Alert, NF, ox3   LUA AVF +bruit     OP HD: [place] MWF   4h  350/600  120kg  2/2.5 bath  LUA AVF Hep 6500 bolus+ 400u/hr  - hect 2.5 ug tiw  - parsabiv 2.5 mg tiw   Assessment/ Plan: 1. COVID+/Acute Hypoxic respiratory failure - oxygenating well on 2L via [place].  Dyspnea improved.  On remdesivir. Per primary 2. Elevated D dimer - No PE on CTA and no DVT on preliminary LE duplex 3.  ESRD - HD MWF. HD today. No heparin in setting of recent hemoptysis/bleeding.  Scheduled for AVF revision on 2/16.   4.  Hypertension/volume  - Blood pressure currently well controlled. Under dry wt 3kg. Continue home meds.  No vol ^ on exam. On furosemide on non dialysis days.  UF as tolerated.  5.  Anemia of CKD - Hgb 7.6. Requesting OP records. No iron due to high ferritin. Transfuse prn.  6.  Secondary Hyperparathyroidism -  Corrected Ca ok.  Will check phos.  Requesting records to verify outpatient meds, will order last known dose VDRA for now.  7.  Nutrition - Renal diet w/fluid restrictions.  8. HFpEF - per primary 9. A fib - new onset in January, not currently on anticoagulation due to hemoptysis 10. DM - per primary   Rob Lashawnda Hancox 12/19/2020, 4:33 PM   Recent Labs  Lab 12/18/20 0344 12/19/20 0503 12/19/20 1424  K 3.6  --  4.2  BUN 22  --  48*  CREATININE 10.26*  --  13.36*  CALCIUM 7.9*  --  8.2*  PHOS 4.1 5.6* 5.8*  HGB 7.6* 7.9* 7.5*   Inpatient medications: . apixaban  5 mg Oral BID  . aspirin EC  81 mg  Oral Daily  . Chlorhexidine Gluconate Cloth  6 each Topical Q0600  . dexamethasone (DECADRON) injection  6 mg Intravenous Q24H  . diltiazem  120 mg Oral Daily  . [START ON 12/20/2020] furosemide  80 mg Oral Once per day on Sun Tue Thu Sat  . hydrALAZINE  25 mg Oral Q8H  . insulin aspart  0-9 Units Subcutaneous TID WC  . linagliptin  5 mg Oral Daily  . losartan  50 mg Oral Daily  . NIFEdipine  90 mg Oral Daily  . pantoprazole  40 mg Oral Daily  . [START ON 12/20/2020] pneumococcal 23 valent vaccine  0.5 mL Intramuscular Tomorrow-1000  . pravastatin  5 mg Oral q1800   . sodium chloride    . sodium chloride    . remdesivir 100 mg in NS 100 mL Stopped (12/18/20 0846)   sodium chloride, sodium chloride, acetaminophen, albuterol, alteplase, chlorpheniramine-HYDROcodone, gabapentin, guaiFENesin-dextromethorphan, heparin, lidocaine (PF), lidocaine-prilocaine, ondansetron **OR** ondansetron (ZOFRAN) IV, pentafluoroprop-tetrafluoroeth

## 2020-12-19 NOTE — Progress Notes (Signed)
Patient being transported to HD at this time.  

## 2020-12-19 NOTE — Progress Notes (Signed)
Family Medicine Teaching Service Daily Progress Note Intern Pager: [phone]  Patient name: Julie [name] Medical record number: [id number] Date of birth: [birth year] Age: 65 y.o. Gender: female  Primary Care Provider: Patient, No Pcp Per Consultants: Nephrology Code Status: Full  Pt Overview and Major Events to Date:  2/5: admitted, COVID+  Assessment and Plan:  Julie [name] a 65 y.o.femalewho presented with dyspnea and was found to be COVID+. PMH is significant for CHF, asthma, HTN,Type 2 DM,ESRD on MWF schedule,goutand recent diagnosis of atrial fibrillation.  Acute Hypoxic Respiratory Failure 2/2 COVID-19 Stable on 1L [place] this morning with SpO2 in the mid to high 90s. D-Dimer 1.89, CRP 2.5 this morning, both mildly uptrending. Has been afebrile over past 24h. -Contact/Airborne precautions -Continue Remdesevir (2/5-2/9) -Continue Decadron '6mg'$  daily (2/6- ) -Tradjenta '5mg'$  daily due to COVID and DM -Supplemental O2 as needed, goal SpO2 >92% -Wean O2 as tolerated today -Incentive spirometry -Encourage vaccination on discharge  A-Fib Currently in sinus rhythm. Home meds: cardizem '120mg'$  and metoprolol '25mg'$  daily. Her home anticoagulation held over the last 5 days secondary to hemoptysis. -Continue home Cardizem and Metoprolol -Will re-start home Eliquis '5mg'$  BID -Cardiac monitoring  T2DM No recent A1c per chart review. BG range 95-159 over past 24h. Did not require SAI yesterday. -Linagliptin '5mg'$  daily -sSSI -CBG monitoring  Diastolic CHF  Left Apical Mural Thinning Chronic, not in exacerbation. CTA showed incidental finding of left apical mural thinning. No recent echo. -Recommend repeat echo as an outpatient -Continue home Lasix during non-HD days  ESRD on HD MWF Last HD 2/4. Nephro following and will plan for HD today per her normal schedule -Appreciate nephro assistance -Daily BMP/RFP  Normocytic Anemia Hgb stable at 7.9.  -Monitor CBC -Transfusion  threshold <7  HTN Normotensive over past 24h. Most recent BP 121/79.  -Continue home Hydralazine, Losartan, and Procardia -Monitor BP   FEN/GI: Carb modified diet PPx: Heparin   Status is: Inpatient Remains inpatient appropriate because:Inpatient level of care appropriate due to severity of illness   Dispo:  Patient From: Home  Planned Disposition: Home  Expected discharge date: 12/22/2020  Medically stable for discharge: No      Subjective:  No acute events overnight. Patient states she's feeling a little better today. Feels her weakness is improving. Still with dyspnea on exertion.  Objective: Temp:  [97.8 F (36.6 C)-98.8 F (37.1 C)] 97.8 F (36.6 C) (02/07 0408) Pulse Rate:  [70-77] 70 (02/07 0408) Resp:  [16-23] 20 (02/07 0408) BP: (107-122)/(57-103) 122/69 (02/07 0408) SpO2:  [92 %-100 %] 92 % (02/07 0408) Physical Exam: General: alert, well appearing, NAD Cardiovascular: RRR, normal S1/S2 without m/r/g Respiratory: normal WOB on 1L, lungs CTAB Abdomen: soft, nontender, nondistended Extremities: trace nonpitting edema of bilateral ankles  Laboratory: Recent Labs  Lab 12/17/20 1227 12/18/20 0344 12/19/20 0503  WBC 3.9* 5.3 2.3*  HGB 7.5* 7.6* 7.9*  HCT 23.3* 24.6* 23.6*  PLT 198 185 222   Recent Labs  Lab 12/17/20 1227 12/18/20 0344  NA 136 135  K 3.2* 3.6  CL 97* 98  CO2 25 26  BUN 17 22  CREATININE 8.69* 10.26*  CALCIUM 7.9* 7.9*  PROT  --  5.4*  BILITOT  --  0.5  ALKPHOS  --  35*  ALT  --  19  AST  --  28  GLUCOSE 120* 103*    Imaging/Diagnostic Tests: VAS [place] LOWER EXTREMITY VENOUS (DVT) Result Date: 12/18/2020 Summary: RIGHT: - There is no evidence of deep vein  thrombosis in the lower extremity. However, portions of this examination were limited- see technologist comments above.  - A cystic structure is found in the popliteal fossa.  LEFT: - There is no evidence of deep vein thrombosis in the lower extremity. However, portions of this  examination were limited- see technologist comments above.  - A cystic structure is found in the popliteal fossa.      Alcus Dad, MD 12/19/2020, 6:15 AM PGY-1, [place] Intern pager: [phone], text pages welcome

## 2020-12-20 DIAGNOSIS — U071 COVID-19: Secondary | ICD-10-CM | POA: Diagnosis not present

## 2020-12-20 DIAGNOSIS — J1282 Pneumonia due to coronavirus disease 2019: Secondary | ICD-10-CM | POA: Diagnosis not present

## 2020-12-20 DIAGNOSIS — N186 End stage renal disease: Secondary | ICD-10-CM | POA: Diagnosis not present

## 2020-12-20 DIAGNOSIS — J9601 Acute respiratory failure with hypoxia: Secondary | ICD-10-CM | POA: Diagnosis not present

## 2020-12-20 LAB — CBC WITH DIFFERENTIAL/PLATELET
Abs Immature Granulocytes: 0.04 10*3/uL (ref 0.00–0.07)
Basophils Absolute: 0 10*3/uL (ref 0.0–0.1)
Basophils Relative: 0 %
Eosinophils Absolute: 0 10*3/uL (ref 0.0–0.5)
Eosinophils Relative: 0 %
HCT: 23.7 % — ABNORMAL LOW (ref 36.0–46.0)
Hemoglobin: 8 g/dL — ABNORMAL LOW (ref 12.0–15.0)
Immature Granulocytes: 1 %
Lymphocytes Relative: 15 %
Lymphs Abs: 0.7 10*3/uL (ref 0.7–4.0)
MCH: 28.8 pg (ref 26.0–34.0)
MCHC: 33.8 g/dL (ref 30.0–36.0)
MCV: 85.3 fL (ref 80.0–100.0)
Monocytes Absolute: 0.3 10*3/uL (ref 0.1–1.0)
Monocytes Relative: 6 %
Neutro Abs: 3.8 10*3/uL (ref 1.7–7.7)
Neutrophils Relative %: 78 %
Platelets: 222 10*3/uL (ref 150–400)
RBC: 2.78 MIL/uL — ABNORMAL LOW (ref 3.87–5.11)
RDW: 13.5 % (ref 11.5–15.5)
WBC: 4.8 10*3/uL (ref 4.0–10.5)
nRBC: 0 % (ref 0.0–0.2)

## 2020-12-20 LAB — BASIC METABOLIC PANEL
Anion gap: 13 (ref 5–15)
BUN: 26 mg/dL — ABNORMAL HIGH (ref 8–23)
CO2: 24 mmol/L (ref 22–32)
Calcium: 8 mg/dL — ABNORMAL LOW (ref 8.9–10.3)
Chloride: 96 mmol/L — ABNORMAL LOW (ref 98–111)
Creatinine, Ser: 7.9 mg/dL — ABNORMAL HIGH (ref 0.44–1.00)
GFR, Estimated: 5 mL/min — ABNORMAL LOW (ref >60)
Glucose, Bld: 154 mg/dL — ABNORMAL HIGH (ref 70–99)
Potassium: 4.1 mmol/L (ref 3.5–5.1)
Sodium: 133 mmol/L — ABNORMAL LOW (ref 135–145)

## 2020-12-20 LAB — MAGNESIUM: Magnesium: 2 mg/dL (ref 1.7–2.4)

## 2020-12-20 LAB — URINE CULTURE

## 2020-12-20 LAB — GLUCOSE, CAPILLARY
Glucose-Capillary: 140 mg/dL — ABNORMAL HIGH (ref 70–99)
Glucose-Capillary: 128 mg/dL — ABNORMAL HIGH (ref 70–99)
Glucose-Capillary: 123 mg/dL — ABNORMAL HIGH (ref 70–99)
Glucose-Capillary: 151 mg/dL — ABNORMAL HIGH (ref 70–99)

## 2020-12-20 LAB — HEPATITIS B SURFACE ANTIGEN: Hepatitis B Surface Ag: NONREACTIVE

## 2020-12-20 LAB — HEPATITIS B SURFACE ANTIBODY,QUALITATIVE: Hep B S Ab: REACTIVE — AB

## 2020-12-20 LAB — PHOSPHORUS: Phosphorus: 3.6 mg/dL (ref 2.5–4.6)

## 2020-12-20 MED ORDER — PREDNISONE 10 MG PO TABS: 20.0000 mg | ORAL_TABLET | Freq: Every day | ORAL | 0 refills | Status: AC

## 2020-12-20 MED ORDER — HYDRALAZINE HCL 25 MG PO TABS: 25.0000 mg | ORAL_TABLET | Freq: Three times a day (TID) | ORAL | 0 refills | Status: DC

## 2020-12-20 NOTE — Discharge Instructions (Signed)
Dear Julie [name],   Thank you for letting us participate in your care! In this section, you will find a brief summary of why you were admitted to the hospital, what happened during your admission, your diagnosis/diagnoses, and recommended follow up.   You were admitted because you were experiencing shortness of breath and weakness.   You were diagnosed with COVID.  You were treated with oxygen, Remdesevir (an anti-viral medication) and steroids.   You were also seen by the nephrologist for dialysis  Your oxygen levels improved and you were discharged from the hospital for meeting this goal.    POST-HOSPITAL & CARE INSTRUCTIONS 1. You should isolate until 2/15 and wear a mask at all times if you need to be around others 2. We would highly highly recommend you get vaccinated against COVID 3. Please see medications section of this packet for any medication changes.   DOCTOR'S APPOINTMENTS & FOLLOW UP Please call your primary care doctor and your cardiologist to schedule follow-up appointments in the next 2-4 weeks.  Thank you for choosing [facility]! Take care and be well!  [facility]  [place], [place] [address] [phone]

## 2020-12-20 NOTE — Progress Notes (Signed)
SATURATION QUALIFICATIONS: (This note is used to comply with regulatory documentation for home oxygen)  Patient Saturations on Room Air at Rest = 99%  Patient Saturations on Room Air while Ambulating = 96%  Patient Saturations on -- Liters of oxygen while Ambulating = N/A  Please briefly explain why patient needs home oxygen: Patient does not require supplemental oxygen at rest or with activity. Of note, pt reports wearing 3L O2 [place] at home since previous hospital admission.  Mabeline Caras, PT, DPT Acute Rehabilitation Services  Pager [phone] Office [phone]

## 2020-12-20 NOTE — Progress Notes (Signed)
Family Medicine Teaching Service Daily Progress Note Intern Pager: [phone]  Patient name: Julie [name] Medical record number: [id number] Date of birth: [birth year] Age: 65 y.o. Gender: female  Primary Care Provider: Patient, No Pcp Per Consultants: Nephrology Code Status: Full  Pt Overview and Major Events to Date:  2/5: admitted, COVID+  Assessment and Plan:  Ziyon Davtyan a 65 y.o.femalewho presented with dyspnea and was found to be COVID+. PMH is significant for CHF, asthma, HTN,Type 2 DM,ESRD on MWF schedule,goutand recent diagnosis of atrial fibrillation.  Acute Hypoxic Respiratory Failure 2/2 COVID-19 Patient has remained stable on room air since 4pm yesterday. Was briefly placed back on 2L [place] overnight. This morning is satting high 90s on room air. -Contact/Airborne precautions -Continue Remdesevir (2/5-2/9) -Continue Decadron '6mg'$  daily (2/6- ) -Tradjenta '5mg'$  daily due to COVID and DM -Supplemental O2 as needed, goal SpO2 >92% -Will obtain ambulatory O2 sats -Incentive spirometry -Encourage vaccination on discharge -PT/OT eval and treat  A-Fib Currently in sinus rhythm. Home meds: cardizem '120mg'$  daily, metoprolol '25mg'$  daily, Eliquis '5mg'$  BID. Eliquis was held earlier this admission due to hemoptysis. -Continue home Cardizem and Metoprolol -Continue Eliquis '5mg'$  BID -Cardiac monitoring  T2DM No recent A1c per chart review. BG range 138-184 over past 24h. Received 3u SAI yesterday. -Linagliptin '5mg'$  daily -sSSI -CBG monitoring  Left Bundle Branch Block  Left Apical Mural Thinning Left apical mural thinning noted on CTA chest. Echo was obtained per cardiology recommendations and showed septal-lateral dysynchrony. Patient with known hx of LBBB. -Outpatient cardiology f/u  Diastolic CHF Chronic, not in exacerbation. Echo obtained yesterday showed Grade 2 diastolic dysfunction. -Continue home Lasix during non-HD days  ESRD on HD MWF Last HD 2/7.  -Nephro  following, appreciate their assistance -Daily BMP/RFP  Acute on Chronic Normocytic Anemia Hgb stable at 8.0. 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 -Monitor CBC -Transfusion threshold <7  HTN Normotensive over past 24h. Most recent BP 118/72.  -Continue home Hydralazine, Losartan, and Procardia -Monitor BP   FEN/GI: Carb modified diet PPx: Heparin   Status is: Inpatient Remains inpatient appropriate because:Inpatient level of care appropriate due to severity of illness   Dispo:  Patient From: Home  Planned Disposition: Home  Expected discharge date: 12/21/2020  Medically stable for discharge: No      Subjective:  No acute events overnight. Patient reports she's feeling better this morning. No significant shortness of breath or weakness. States she's able to ambulate to the bathroom. Last BM this morning.  Objective: Temp:  [97.9 F (36.6 C)-98.2 F (36.8 C)] 98 F (36.7 C) (02/08 0417) Pulse Rate:  [73-87] 78 (02/08 0417) Resp:  [15-20] 19 (02/08 0417) BP: (98-136)/(48-79) 118/72 (02/08 0417) SpO2:  [90 %-99 %] 94 % (02/08 0417) Weight:  [115.4 kg-117.9 kg] 115.4 kg (02/07 1943) Physical Exam: General: alert, well appearing, NAD Cardiovascular: RRR, normal S1/S2 without m/r/g Respiratory: normal WOB on 1L, lungs CTAB Abdomen: soft, nontender, nondistended Extremities: no peripheral edema  Laboratory: Recent Labs  Lab 12/19/20 0503 12/19/20 1424 12/20/20 0212  WBC 2.3* 3.6* 4.8  HGB 7.9* 7.5* 8.0*  HCT 23.6* 22.6* 23.7*  PLT 222 233 222   Recent Labs  Lab 12/18/20 0344 12/19/20 1424 12/20/20 0212  NA 135 132* 133*  K 3.6 4.2 4.1  CL 98 95* 96*  CO2 26 21* 24  BUN 22 48* 26*  CREATININE 10.26* 13.36* 7.90*  CALCIUM 7.9* 8.2* 8.0*  PROT 5.4*  --   --   BILITOT  0.5  --   --   ALKPHOS 35*  --   --   ALT 19  --   --   AST 28  --   --   GLUCOSE 103* 138* 154*    Imaging/Diagnostic Tests: ECHOCARDIOGRAM  LIMITED Result Date: 12/19/2020 IMPRESSIONS   1. Left ventricular ejection fraction, by estimation, is 55%. The left ventricle has normal function. The left ventricle has no regional wall motion abnormalities. There is septal-lateral dyssynchrony. There is mild left ventricular hypertrophy. Left ventricular diastolic parameters are consistent with Grade II diastolic dysfunction (pseudonormalization).   2. Right ventricular systolic function is normal. The right ventricular size is normal. There is mildly elevated pulmonary artery systolic pressure. The estimated right ventricular systolic pressure is 99991111 mmHg.   3. The mitral valve is normal in structure. Mild mitral valve regurgitation. No evidence of mitral stenosis.   4. The aortic valve is tricuspid. Aortic valve regurgitation is not visualized. Mild aortic valve stenosis. Aortic valve mean gradient measures 13.0 mmHg.   5. The inferior vena cava is normal in size with greater than 50% respiratory variability, suggesting right atrial pressure of 3 mmHg.     Alcus Dad, MD 12/20/2020, 6:23 AM PGY-1, [place] Intern pager: [phone], text pages welcome

## 2020-12-20 NOTE — Hospital Course (Addendum)
Julie [name] is a 65 y.o. female who presented with dyspnea and was found to be COVID+. PMH is significant for CHF, asthma, HTN, Type 2 DM, ESRD on MWF schedule, gout and recent diagnosis of atrial fibrillation.    Acute Hypoxic Respiratory Failure 2/2 COVID-19 Presented afebrile and hypoxic after desaturating to 88% on room air in the ED. Hypoxic condition likely secondary to COVID positive status. Labs notable for ferritin 3295, procalcitonin 0.50, Hgb 7.5, d-dimer 0.99, WBC 3.9 and CRP 1.4.Patient was initially placed on 2L O2. Started on remdesivir for a complete course of 4 days and decadron started on 2/6 with appropriate tapering. Oxygen was weaned off as tolerated and she was discharged on room air after not noting to have any further desaturations either at rest or with exertion. Patient discharged with prednisone 20 mg.  Atrial Fibrillation Patient remained in sinus rhythm throughout admission. She was maintained on her home cardizem and metoprolol. Her Eliquis was initially held due to hemoptysis which resolved. She was restarted on Eliquis on 12/19/2020.   Incidental cardiac finding  CTA chest demonstrated left apical mural thinning which may indicate that possible myocardial weakening. No known prior history of MI. Echo performed on 12/19/2020 notable for EF 55% with septal-lateral dyssynchrony and mild left ventricular hypertrophy, consistent with Grade 2 diastolic dysfunction.     Issues for follow up   Monitor CBG given that patient was discharged on prednisone. Regarding incidental cardiac findings, patient may benefit from further follow up, consider cardiology outpatient as deemed appropriate. Monitor for hemoptysis and other sources of bleeding given Eliquis was restarted prior to discharge.  Consider repeat CBC, baseline Hgb appears to be 7-8, transfusion threshold <7.  No recent A1c, repeat outpatient.  Patient will benefit from [place] vaccination.

## 2020-12-20 NOTE — Progress Notes (Signed)
[facility] Progress Note  Subjective: seen in room, no c/o  Vitals:   12/19/20 2019 12/20/20 0417 12/20/20 1356 12/20/20 1423  BP: 120/70 118/72 101/63 112/74  Pulse: 87 78  78  Resp: '18 19  20  '$ Temp: 98.2 F (36.8 C) 98 F (36.7 C)  98.1 F (36.7 C)  TempSrc: Oral Oral  Oral  SpO2: 90% 94%  97%  Weight:      Height:        Exam:   alert, nad , nasal O2  no jvd  Chest cta bilat  Cor reg no RG  Abd soft ntnd no ascites   Ext no LE edema   Alert, NF, ox3   LUA AVF +bruit     OP HD: [place] MWF   4h  350/600  120kg  2/2.5 bath  LUA AVF Hep 6500 bolus+ 400u/hr  - hect 2.5 ug tiw  - parsabiv 2.5 mg tiw   Assessment/ Plan: 1. COVID+/Acute Hypoxic respiratory failure - oxygenating well on 2L via [place].  Dyspnea improved.  On remdesivir. Per primary 2. Elevated D dimer - No PE on CTA and no DVT on preliminary LE duplex 3.  ESRD - HD MWF. HD today. No heparin in setting of recent hemoptysis/bleeding.  Scheduled for AVF revision on 2/16.   4.  Hypertension/volume  - Blood pressure currently well controlled. Under dry wt 3kg. Continue home meds.  No vol ^ on exam. On furosemide on non dialysis days.  UF as tolerated.  5.  Anemia of CKD - Hgb 7.6. Requesting OP records. No iron due to high ferritin. Transfuse prn.  6.  Secondary Hyperparathyroidism -  Corrected Ca ok.  Will check phos.  Requesting records to verify outpatient meds, will order last known dose VDRA for now.  7.  Nutrition - Renal diet w/fluid restrictions.  8. HFpEF - per primary 9. A fib - new onset in January, not currently on anticoagulation due to hemoptysis 10. DM - per primary   Rob Wayne Brunker 12/20/2020, 4:32 PM   Recent Labs  Lab 12/19/20 1424 12/20/20 0212  K 4.2 4.1  BUN 48* 26*  CREATININE 13.36* 7.90*  CALCIUM 8.2* 8.0*  PHOS 5.8* 3.6  HGB 7.5* 8.0*   Inpatient medications: . apixaban  5 mg Oral BID  . aspirin EC  81 mg Oral Daily  . Chlorhexidine Gluconate Cloth  6 each  Topical Q0600  . dexamethasone (DECADRON) injection  6 mg Intravenous Q24H  . diltiazem  120 mg Oral Daily  . furosemide  80 mg Oral Once per day on Sun Tue Thu Sat  . hydrALAZINE  25 mg Oral Q8H  . insulin aspart  0-9 Units Subcutaneous TID WC  . linagliptin  5 mg Oral Daily  . losartan  50 mg Oral Daily  . NIFEdipine  90 mg Oral Daily  . pantoprazole  40 mg Oral Daily  . pneumococcal 23 valent vaccine  0.5 mL Intramuscular Tomorrow-1000  . pravastatin  5 mg Oral q1800   . remdesivir 100 mg in NS 100 mL 100 mg (12/20/20 1013)   acetaminophen, albuterol, chlorpheniramine-HYDROcodone, gabapentin, guaiFENesin-dextromethorphan, ondansetron **OR** ondansetron (ZOFRAN) IV

## 2020-12-20 NOTE — Evaluation (Signed)
Physical Therapy Evaluation Patient Details Name: Julie [name] MRN: [id number] DOB: 25-Feb-1956 Today's Date: 12/20/2020   History of Present Illness  Pt is a 65 y.o. female admitted 12/17/20 with SOB; workup for acute hypoxic respiratory failure secondary to COVID-19 PNA, afib. PMH includes CHF, asthma, HTN, DM2, ESRD (HD MWF), gout.    Clinical Impression  Pt presents with an overall decrease in functional mobility secondary to above. PTA, pt reports mod indep with SPC and rollator, lives alone with intermittent assist from daughter, uses transportation for HD/appts. Initiated education re: current condition, O2 needs, activity recommendations, and importance of mobility. Today, pt able to mobilize short distances with RW and min guard for balance. SpO2 96-100% on RA (see saturations qualifications note). Pt would benefit from continued acute PT services to maximize functional mobility and independence prior to d/c with HHPT services; pt also requesting increased support from a [facility] aide.    Follow Up Recommendations Home health PT;Supervision - Intermittent ([place] aide)    Equipment Recommendations  None recommended by PT    Recommendations for Other Services       Precautions / Restrictions Precautions Precautions: Fall;Other (comment) Precaution Comments: Reports wearing 3L O2 [place] at home Restrictions Weight Bearing Restrictions: No      Mobility  Bed Mobility Overal bed mobility: Independent             General bed mobility comments: Return to supine with HOB flat    Transfers Overall transfer level: Needs assistance Equipment used: Rolling walker (2 wheeled)             General transfer comment: Pt requesting use of RW (as opposed to her [facility]); reliant on momentum to power into standing pulling on RW, min guard for balance; cues for correct hand placement and stability improved standing 2x more from chair and EOB to RW  Ambulation/Gait Ambulation/Gait assistance: Min  guard Gait Distance (Feet): 16 Feet (+14') Assistive device: Rolling walker (2 wheeled) Gait Pattern/deviations: Step-through pattern;Wide base of support;Trunk flexed Gait velocity: Decreased   General Gait Details: Slow, fatigued gait with RW and min guard for balance; pt with increased fatigue requiring chair to be brought up for seated rest break; pt reports, "Sitting in that chair will make you feel weak and dizzy." DOE 2-3/4  Stairs            Wheelchair Mobility    Modified Rankin (Stroke Patients Only)       Balance Overall balance assessment: Needs assistance   Sitting balance-Leahy Scale: Good       Standing balance-Leahy Scale: Fair Standing balance comment: Can static stand without UE support; static and dynamic stability improved with UE support                             Pertinent Vitals/Pain Pain Assessment: Faces Faces Pain Scale: Hurts little more Pain Location: back Pain Descriptors / Indicators: Constant Pain Intervention(s): Monitored during session;Limited activity within patient's tolerance    Home Living Family/patient expects to be discharged to:: Private residence Living Arrangements: Alone Available Help at Discharge: Family;Available PRN/intermittently Type of Home: Apartment Home Access: Stairs to enter Entrance Stairs-Rails: None Entrance Stairs-Number of Steps: 3 Home Layout: One level Home Equipment: Walker - 4 wheels;Cane - single point Additional Comments: Daughter lives nearby, but busy with family of her own    Prior Function Level of Independence: Independent with assistive device(s)  Comments: Mod indep household ambulator with rollator or SPC, uses SPC on steps into home; more limited recently by SOB and fatigue with ADL tasks. Uses transportation to HD/appts; daughter typically picks up groceries     Hand Dominance        Extremity/Trunk Assessment   Upper Extremity Assessment Upper  Extremity Assessment: Overall WFL for tasks assessed    Lower Extremity Assessment Lower Extremity Assessment: Overall WFL for tasks assessed       Communication   Communication: No difficulties  Cognition Arousal/Alertness: Awake/alert Behavior During Therapy: WFL for tasks assessed/performed Overall Cognitive Status: No family/caregiver present to determine baseline cognitive functioning Area of Impairment: Attention;Safety/judgement;Awareness;Problem solving;Following commands                   Current Attention Level: Selective   Following Commands: Follows one step commands with increased time Safety/Judgement: Decreased awareness of safety;Decreased awareness of deficits Awareness: Emergent Problem Solving: Requires verbal cues General Comments: Following majority of commands and answering questions. Some repetition regarding medical history and difficulties at home requiring redirection to subject and task at hand. Pt with poor insight into current condition      General Comments General comments (skin integrity, edema, etc.): SpO2 99-100% on RA at rest, down to 96% on RA with activity (RN aware). Increased time discussing d/c recommendations and pt reports increased difficulty at home - pt requesting [place] services, including aide assist (will notifiy CM)    Exercises     Assessment/Plan    PT Assessment Patient needs continued PT services  PT Problem List Decreased strength;Decreased activity tolerance;Decreased balance;Decreased mobility;Obesity       PT Treatment Interventions DME instruction;Gait training;Stair training;Functional mobility training;Therapeutic activities;Therapeutic exercise;Balance training;Patient/family education    PT Goals (Current goals can be found in the Care Plan section)  Acute Rehab PT Goals Patient Stated Goal: "I need more help at home" PT Goal Formulation: With patient Time For Goal Achievement: 01/03/21 Potential to Achieve  Goals: Good    Frequency Min 3X/week   Barriers to discharge Decreased caregiver support      Co-evaluation               AM-PAC PT "6 Clicks" Mobility  Outcome Measure Help needed turning from your back to your side while in a flat bed without using bedrails?: None Help needed moving from lying on your back to sitting on the side of a flat bed without using bedrails?: None Help needed moving to and from a bed to a chair (including a wheelchair)?: A Little Help needed standing up from a chair using your arms (e.g., wheelchair or bedside chair)?: A Little Help needed to walk in hospital room?: A Little Help needed climbing 3-5 steps with a railing? : A Little 6 Click Score: 20    End of Session   Activity Tolerance: Patient tolerated treatment well;Patient limited by fatigue Patient left: in bed;with call bell/phone within reach;with bed alarm set Nurse Communication: Mobility status PT Visit Diagnosis: Other abnormalities of gait and mobility (R26.89);Muscle weakness (generalized) (M62.81)    Time: [id number] PT Time Calculation (min) (ACUTE ONLY): 21 min   Charges:   PT Evaluation $PT Eval Moderate Complexity: 1 Mod     Mabeline Caras, PT, DPT Acute Rehabilitation Services  Pager [phone] Office [place] 12/20/2020, 1:52 PM

## 2020-12-20 NOTE — Discharge Summary (Addendum)
[facility] Discharge Summary  Patient name: Julie [name] Medical record number: [id number] Date of birth: 04/18/1956 Age: 65 y.o. Gender: female Date of Admission: 12/17/2020  Date of Discharge: 12/21/2020 Admitting Physician: Matilde Haymaker, MD  Primary Care Provider: Patient, No Pcp Per Consultants: nephrology   Indication for Hospitalization: acute respiratory failure   Discharge Diagnoses/Problem List:  Acute respiratory failure secondary to Covid Atrial fibrillation Apical mural thickening Hypertension Diastolic congestive heart failure Obesity Type 2 diabetes End-stage renal disease Asthma Gout Normocytic anemia    Disposition: Home  Discharge Condition: Stable and improving  Discharge Exam:   General: Patient laying comfortably in bed watching tv, in no acute distress. HEENT: supple neck, no evidence of lymphadenopathy  CV: RRR, no murmurs or gallops auscultated  Resp: lungs clear to auscultation bilaterally, breathing and talking comfortably on room air with 97% O2 sat, no increased work of breathing noted  Abdomen: soft, nontender, presence of active bowel sounds Derm: skin warm to touch, no new rashes or lesions noted Ext: radial and distal pulses strong and equal bilaterally, no LE edema noted bilaterally   Brief Hospital Course:  Julie [name] is a 65 y.o. female who presented with dyspnea and was found to be COVID+. PMH is significant for CHF, asthma, HTN, Type 2 DM, ESRD on MWF schedule, gout and recent diagnosis of atrial fibrillation.    Acute Hypoxic Respiratory Failure 2/2 COVID-19 Presented afebrile and hypoxic after desaturating to 88% on room air in the ED. Hypoxic condition likely secondary to COVID positive status. Labs notable for ferritin 3295, procalcitonin 0.50, Hgb 7.5, d-dimer 0.99, WBC 3.9 and CRP 1.4. Patient was initially placed on 2L O2. Started on Liraglutide and remdesivir for a complete course of 4 days and  decadron started on 2/6. She was discharged on room air after not noting to have any further desaturations either at rest or with exertion. Patient discharged with prednisone 20 mg for 4 days.  Atrial Fibrillation New onset in Jan 2022. Patient remained in sinus rhythm throughout admission. She was maintained on her home cardizem and metoprolol. Her Eliquis was initially held due to hemoptysis which resolved. She was restarted on Eliquis on 12/19/2020.   Lower extremity pain  Bilateral DVT ultrasound negative. Pain treated conservatively.   Incidental cardiac finding  CTA chest demonstrated left apical mural thinning which may indicate that possible myocardial weakening. No known prior history of MI. Echo performed on 12/19/2020 notable for EF 55% with septal-lateral dyssynchrony and mild left ventricular hypertrophy, consistent with Grade 2 diastolic dysfunction.   Home medications were continued for other chronic diseases which remained stable.     Issues for Follow Up:   Patient discharged with prednisone. Recommend checking CBG at follow up.   No recent A1c, repeat outpatient.   Regarding incidental cardiac findings, patient may benefit from cardiology follow up.  Monitor for hemoptysis and other sources of bleeding given Eliquis was restarted prior to discharge.   Last HD session 12/20/20.   Patient will benefit from Warrior vaccination.   Significant Procedures:  Hemodialysis   Significant Labs and Imaging:  Recent Labs  Lab 12/19/20 1424 12/20/20 0212 12/21/20 0116  WBC 3.6* 4.8 6.8  HGB 7.5* 8.0* 7.9*  HCT 22.6* 23.7* 23.0*  PLT 233 222 290   Recent Labs  Lab 12/17/20 1227 12/18/20 0344 12/19/20 0503 12/19/20 1424 12/20/20 0212 12/21/20 0116  NA 136 135  --  132* 133* 133*  K 3.2* 3.6  --  4.2  4.1 4.4  CL 97* 98  --  95* 96* 95*  CO2 25 26  --  21* 24 22  GLUCOSE 120* 103*  --  138* 154* 143*  BUN 17 22  --  48* 26* 45*  CREATININE 8.69* 10.26*  --  13.36*  7.90* 9.95*  CALCIUM 7.9* 7.9*  --  8.2* 8.0* 8.2*  MG  --  1.8 2.1  --  2.0 2.0  PHOS  --  4.1 5.6* 5.8* 3.6 5.6*  ALKPHOS  --  35*  --   --   --   --   AST  --  28  --   --   --   --   ALT  --  19  --   --   --   --   ALBUMIN  --  2.7*  --  2.9*  --   --     DG Chest 2 View  Result Date: 12/17/2020 CLINICAL DATA:  Shortness of breath, increased shortness of breath and weakness for 4 days. EXAM: CHEST - 2 VIEW COMPARISON:  May 19, 2013 evaluation. FINDINGS: Trachea midline. Cardiomediastinal contours with stable cardiac enlargement. Central pulmonary vascular engorgement. No frank edema. No lobar consolidation. No pleural effusion. Study limited by body habitus. On limited assessment no acute skeletal process. IMPRESSION: Cardiomegaly with central pulmonary vascular engorgement. No acute cardiopulmonary process. Electronically Signed   By: Zetta Bills M.D.   On: 12/17/2020 12:42   CT Angio Chest PE W/Cm &/Or Wo Cm  Result Date: 12/17/2020 CLINICAL DATA:  Short of breath and weakness for 4 days EXAM: CT ANGIOGRAPHY CHEST WITH CONTRAST TECHNIQUE: Multidetector CT imaging of the chest was performed using the standard protocol during bolus administration of intravenous contrast. Multiplanar CT image reconstructions and MIPs were obtained to evaluate the vascular anatomy. CONTRAST:  132m OMNIPAQUE IOHEXOL 350 MG/ML SOLN COMPARISON:  12/17/2020 FINDINGS: Cardiovascular: This is a technically adequate evaluation of the pulmonary vasculature. No filling defects or pulmonary emboli. Heart is enlarged without pericardial effusion. Prominent left ventricular hypertrophy, with left apical thinning likely due to prior infarct. No evidence of thoracic aortic aneurysm or dissection. Mild atherosclerosis. Mediastinum/Nodes: No enlarged mediastinal, hilar, or axillary lymph nodes. Thyroid gland, trachea, and esophagus demonstrate no significant findings. Lungs/Pleura: Dependent hypoventilatory changes are seen at  the lung bases. There are minimal areas of subpleural ground-glass consolidation which may reflect early edema or infection. No effusion or pneumothorax. Central airways are patent. Upper Abdomen: No acute abnormality. Musculoskeletal: No acute or destructive bony lesion. Reconstructed images demonstrate no additional findings. Review of the MIP images confirms the above findings. IMPRESSION: 1. No evidence of pulmonary embolus. 2. Minimal areas of subpleural ground-glass consolidation which may reflect early edema or infection. 3. Cardiomegaly with left ventricular hypertrophy. Left apical mural thinning may reflect sequela of previous infarct. 4. Aortic Atherosclerosis (ICD10-I70.0). Electronically Signed   By: MRanda NgoM.D.   On: 12/17/2020 16:56   VAS [place]OWER EXTREMITY VENOUS (DVT)  Result Date: 12/18/2020  Lower Venous DVT Study Indications: Covid, LT thigh pain.  Limitations: Body habitus and depth and size of vessels. Comparison Study: No prior studies. Performing Technologist: RDarlin CocoRDMS  Examination Guidelines: A complete evaluation includes B-mode imaging, spectral Doppler, color Doppler, and power Doppler as needed of all accessible portions of each vessel. Bilateral testing is considered an integral part of a complete examination. Limited examinations for reoccurring indications may be performed as noted. The reflux portion of the exam  is performed with the patient in reverse Trendelenburg.  +---------+---------------+---------+-----------+----------+-------------------+ RIGHT    CompressibilityPhasicitySpontaneityPropertiesThrombus Aging      +---------+---------------+---------+-----------+----------+-------------------+ CFV      Full           Yes      Yes                                      +---------+---------------+---------+-----------+----------+-------------------+ SFJ      Full                                                              +---------+---------------+---------+-----------+----------+-------------------+ FV Prox  Full                                                             +---------+---------------+---------+-----------+----------+-------------------+ FV Mid                  Yes      Yes                                      +---------+---------------+---------+-----------+----------+-------------------+ FV DistalFull           Yes      Yes                                      +---------+---------------+---------+-----------+----------+-------------------+ PFV      Full                                                             +---------+---------------+---------+-----------+----------+-------------------+ POP      Full           Yes      Yes                                      +---------+---------------+---------+-----------+----------+-------------------+ PTV                                                   Not well visualized +---------+---------------+---------+-----------+----------+-------------------+ PERO                                                  Not well visualized +---------+---------------+---------+-----------+----------+-------------------+   +---------+---------------+---------+-----------+----------+-------------------+ LEFT     CompressibilityPhasicitySpontaneityPropertiesThrombus Aging      +---------+---------------+---------+-----------+----------+-------------------+ CFV  Full           Yes      Yes                                      +---------+---------------+---------+-----------+----------+-------------------+ SFJ      Full                                                             +---------+---------------+---------+-----------+----------+-------------------+ FV Prox  Full                                                             +---------+---------------+---------+-----------+----------+-------------------+ FV  Mid   Full                                                             +---------+---------------+---------+-----------+----------+-------------------+ FV DistalFull                                                             +---------+---------------+---------+-----------+----------+-------------------+ PFV      Full                                                             +---------+---------------+---------+-----------+----------+-------------------+ POP      Full           Yes      Yes                                      +---------+---------------+---------+-----------+----------+-------------------+ PTV                     Yes      Yes                                      +---------+---------------+---------+-----------+----------+-------------------+ PERO                                                  Not well visualized +---------+---------------+---------+-----------+----------+-------------------+    Summary: RIGHT: - There is no evidence of deep vein thrombosis in the lower extremity. However, portions of this examination were limited- see technologist comments above.  -  A cystic structure is found in the popliteal fossa.  LEFT: - There is no evidence of deep vein thrombosis in the lower extremity. However, portions of this examination were limited- see technologist comments above.  - A cystic structure is found in the popliteal fossa.  *See table(s) above for measurements and observations. Electronically signed by Monica Martinez MD on 12/18/2020 at 2:29:15 PM.    Final    ECHOCARDIOGRAM LIMITED  Result Date: 12/19/2020    ECHOCARDIOGRAM LIMITED REPORT   Patient Name:   CARLIE KARABA Date of Exam: 12/19/2020 Medical Rec #:  [id number]     Height:       66.0 in Accession #:    [id number]    Weight:       259.0 lb Date of Birth:  1956/05/21      BSA:          2.232 m Patient Age:    65 years      BP:           102/52 mmHg Patient Gender: F             HR:            73 bpm. Exam Location:  Inpatient Procedure: Limited Echo, Limited Color Doppler and Cardiac Doppler Indications:    abnormal CTA  History:        Patient has prior history of Echocardiogram examinations, most                 recent 02/04/2013. CHF, Covid, Arrythmias:LBBB; Risk                 Factors:Hypertension.  Sonographer:    Johny Chess Referring Phys: [place]  1. Left ventricular ejection fraction, by estimation, is 55%. The left ventricle has normal function. The left ventricle has no regional wall motion abnormalities. There is septal-lateral dyssynchrony. There is mild left ventricular hypertrophy. Left ventricular diastolic parameters are consistent with Grade II diastolic dysfunction (pseudonormalization).  2. Right ventricular systolic function is normal. The right ventricular size is normal. There is mildly elevated pulmonary artery systolic pressure. The estimated right ventricular systolic pressure is 99991111 mmHg.  3. The mitral valve is normal in structure. Mild mitral valve regurgitation. No evidence of mitral stenosis.  4. The aortic valve is tricuspid. Aortic valve regurgitation is not visualized. Mild aortic valve stenosis. Aortic valve mean gradient measures 13.0 mmHg.  5. The inferior vena cava is normal in size with greater than 50% respiratory variability, suggesting right atrial pressure of 3 mmHg. FINDINGS  Left Ventricle: Left ventricular ejection fraction, by estimation, is 55%. The left ventricle has normal function. The left ventricle has no regional wall motion abnormalities. The left ventricular internal cavity size was normal in size. There is mild left ventricular hypertrophy. Left ventricular diastolic parameters are consistent with Grade II diastolic dysfunction (pseudonormalization). Right Ventricle: The right ventricular size is normal. No increase in right ventricular wall thickness. Right ventricular systolic function is normal. There is mildly  elevated pulmonary artery systolic pressure. The tricuspid regurgitant velocity is 3.08  m/s, and with an assumed right atrial pressure of 3 mmHg, the estimated right ventricular systolic pressure is 99991111 mmHg. Pericardium: Trivial pericardial effusion is present. Mitral Valve: The mitral valve is normal in structure. Mild mitral valve regurgitation. No evidence of mitral valve stenosis. Tricuspid Valve: The tricuspid valve is normal in structure. Tricuspid valve regurgitation is mild. Aortic Valve: The aortic valve is tricuspid. Aortic valve  regurgitation is not visualized. Mild aortic stenosis is present. Aortic valve mean gradient measures 13.0 mmHg. Aortic valve peak gradient measures 22.7 mmHg. Aortic valve area, by VTI measures 1.46 cm. Aorta: The aortic root is normal in size and structure. Venous: The inferior vena cava is normal in size with greater than 50% respiratory variability, suggesting right atrial pressure of 3 mmHg. LEFT VENTRICLE PLAX 2D LVIDd:         5.40 cm  Diastology LVIDs:         3.40 cm  LV e' medial:    8.70 cm/s LV PW:         1.10 cm  LV E/e' medial:  11.6 LV IVS:        1.20 cm  LV e' lateral:   11.10 cm/s LVOT diam:     1.70 cm  LV E/e' lateral: 9.1 LV SV:         73 LV SV Index:   33 LVOT Area:     2.27 cm  IVC IVC diam: 2.00 cm LEFT ATRIUM         Index LA diam:    3.90 cm 1.75 cm/m  AORTIC VALVE AV Area (Vmax):    1.50 cm AV Area (Vmean):   1.56 cm AV Area (VTI):     1.46 cm AV Vmax:           238.00 cm/s AV Vmean:          166.000 cm/s AV VTI:            0.497 m AV Peak Grad:      22.7 mmHg AV Mean Grad:      13.0 mmHg LVOT Vmax:         157.00 cm/s LVOT Vmean:        114.000 cm/s LVOT VTI:          0.320 m LVOT/AV VTI ratio: 0.64  AORTA Ao Root diam: 2.90 cm MITRAL VALVE                TRICUSPID VALVE MV Area (PHT): 3.72 cm     TR Peak grad:   37.9 mmHg MV Decel Time: 204 msec     TR Vmax:        308.00 cm/s MV E velocity: 101.00 cm/s MV A velocity: 89.70 cm/s   SHUNTS MV  E/A ratio:  1.13         Systemic VTI:  0.32 m                             Systemic Diam: 1.70 cm Loralie Champagne MD Electronically signed by Loralie Champagne MD Signature Date/Time: 12/19/2020/5:22:46 PM    Final      Results/Tests Pending at Time of Discharge: None    Discharge Medications:  Allergies as of 12/21/2020      Reactions   Iron Other (See Comments)   Tongue swelling   Atorvastatin Other (See Comments)   Muscle cramps   Carvedilol Other (See Comments)   alopecia   Metoprolol Other (See Comments)   Hair thinning   Rosuvastatin Other (See Comments)   Muscle cramps      Medication List    STOP taking these medications   amLODipine 10 MG tablet Commonly known as: NORVASC   atorvastatin 10 MG tablet Commonly known as: LIPITOR   clotrimazole 1 % cream Commonly known as: LOTRIMIN   lisinopril 10 MG tablet  Commonly known as: ZESTRIL   sulfamethoxazole-trimethoprim 800-160 MG tablet Commonly known as: Septra DS   traMADol-acetaminophen 37.5-325 MG tablet Commonly known as: ULTRACET     TAKE these medications   acetaminophen 500 MG tablet Commonly known as: TYLENOL Take 500-1,000 mg by mouth daily as needed for pain.   albuterol 108 (90 Base) MCG/ACT inhaler Commonly known as: VENTOLIN HFA Inhale 2 puffs into the lungs every 4 (four) hours as needed for wheezing.   allopurinol 100 MG tablet Commonly known as: ZYLOPRIM Take 100 mg by mouth daily.   aspirin EC 81 MG tablet Take 81 mg by mouth daily.   calcium carbonate 1250 (500 Ca) MG chewable tablet Commonly known as: OS-CAL Chew 2 tablets by mouth 3 (three) times daily with meals.   diltiazem 120 MG 24 hr capsule Commonly known as: CARDIZEM CD Take 120 mg by mouth daily.   Eliquis 5 MG Tabs tablet Generic drug: apixaban Take 5 mg by mouth 2 (two) times daily.   fluticasone 50 MCG/ACT nasal spray Commonly known as: FLONASE Place 2 sprays into the nose daily.   furosemide 80 MG tablet Commonly  known as: LASIX Take 80 mg by mouth See admin instructions. Taking on off dialysis days Tues, Thur, Sat, Sunday   gabapentin 100 MG capsule Commonly known as: NEURONTIN Take 100 mg by mouth daily. May take an additional 1 capsule four hours after dialysis.   glipiZIDE 5 MG 24 hr tablet Commonly known as: GLUCOTROL XL Take 5 mg by mouth daily.   hydrALAZINE 25 MG tablet Commonly known as: APRESOLINE Take 1 tablet (25 mg total) by mouth every 8 (eight) hours.   hydrOXYzine 10 MG tablet Commonly known as: ATARAX/VISTARIL Take 5-10 mg by mouth daily as needed for itching.   losartan 25 MG tablet Commonly known as: COZAAR Take 25 mg by mouth 2 (two) times daily.   Melatonin 5 MG Caps Take 5 mg by mouth daily.   NIFEdipine 90 MG 24 hr tablet Commonly known as: PROCARDIA XL/NIFEDICAL-XL Take 90 mg by mouth daily.   nitroGLYCERIN 0.4 MG SL tablet Commonly known as: NITROSTAT Place 0.4 mg under the tongue every 5 (five) minutes as needed for chest pain (1 tablet under tongue every 5 minutes as needed for chest pain).   omeprazole 20 MG capsule Commonly known as: PRILOSEC Take 20 mg by mouth 2 (two) times daily before a meal. 2 capsules, 2x/day   pravastatin 10 MG tablet Commonly known as: PRAVACHOL Take 5 mg by mouth daily.   predniSONE 10 MG tablet Commonly known as: DELTASONE Take 2 tablets (20 mg total) by mouth daily for 4 days.   Restasis 0.05 % ophthalmic emulsion Generic drug: cycloSPORINE Place 1 drop into both eyes in the morning and at bedtime.       Discharge Instructions: Please refer to Patient Instructions section of EMR for full details.  Patient was counseled important signs and symptoms that should prompt return to medical care, changes in medications, dietary instructions, activity restrictions, and follow up appointments.   Follow-Up Appointments:   Donney Dice, DO 12/21/2020 PGY-1, [place], DO   PGY-2, [facility] 12/21/2020

## 2020-12-21 DIAGNOSIS — N186 End stage renal disease: Secondary | ICD-10-CM | POA: Diagnosis not present

## 2020-12-21 DIAGNOSIS — U071 COVID-19: Secondary | ICD-10-CM | POA: Diagnosis not present

## 2020-12-21 DIAGNOSIS — R778 Other specified abnormalities of plasma proteins: Secondary | ICD-10-CM | POA: Diagnosis not present

## 2020-12-21 DIAGNOSIS — J9601 Acute respiratory failure with hypoxia: Secondary | ICD-10-CM | POA: Diagnosis not present

## 2020-12-21 LAB — CBC
HCT: 23 % — ABNORMAL LOW (ref 36.0–46.0)
Hemoglobin: 7.9 g/dL — ABNORMAL LOW (ref 12.0–15.0)
MCH: 30 pg (ref 26.0–34.0)
MCHC: 34.3 g/dL (ref 30.0–36.0)
MCV: 87.5 fL (ref 80.0–100.0)
Platelets: 290 10*3/uL (ref 150–400)
RBC: 2.63 MIL/uL — ABNORMAL LOW (ref 3.87–5.11)
RDW: 13.9 % (ref 11.5–15.5)
WBC: 6.8 10*3/uL (ref 4.0–10.5)
nRBC: 0 % (ref 0.0–0.2)

## 2020-12-21 LAB — MAGNESIUM: Magnesium: 2 mg/dL (ref 1.7–2.4)

## 2020-12-21 LAB — BASIC METABOLIC PANEL
Anion gap: 16 — ABNORMAL HIGH (ref 5–15)
BUN: 45 mg/dL — ABNORMAL HIGH (ref 8–23)
CO2: 22 mmol/L (ref 22–32)
Calcium: 8.2 mg/dL — ABNORMAL LOW (ref 8.9–10.3)
Chloride: 95 mmol/L — ABNORMAL LOW (ref 98–111)
Creatinine, Ser: 9.95 mg/dL — ABNORMAL HIGH (ref 0.44–1.00)
GFR, Estimated: 4 mL/min — ABNORMAL LOW (ref >60)
Glucose, Bld: 143 mg/dL — ABNORMAL HIGH (ref 70–99)
Potassium: 4.4 mmol/L (ref 3.5–5.1)
Sodium: 133 mmol/L — ABNORMAL LOW (ref 135–145)

## 2020-12-21 LAB — PHOSPHORUS: Phosphorus: 5.6 mg/dL — ABNORMAL HIGH (ref 2.5–4.6)

## 2020-12-21 LAB — GLUCOSE, CAPILLARY: Glucose-Capillary: 128 mg/dL — ABNORMAL HIGH (ref 70–99)

## 2020-12-21 NOTE — Progress Notes (Signed)
FPTS Interim Progress Note  Spoke with outpatient dialysis center where patient will be receiving COVID dialysis-- [facility] in [place]. They will dialyze patient today as long as she arrives by 1pm.  Subsequently spoke with patient's daughter who will pick patient up by 11:30am to transport to outpatient dialysis.   Alcus Dad, MD 12/21/2020, 8:56 AM PGY-1, [facility] pager [phone]

## 2020-12-21 NOTE — Evaluation (Signed)
Occupational Therapy Evaluation Patient Details Name: Julie [name] MRN: [id number] DOB: Nov 04, 1956 Today's Date: 12/21/2020    History of Present Illness Pt is a 65 y.o. female admitted 12/17/20 with SOB; workup for acute hypoxic respiratory failure secondary to COVID-19 PNA, afib. PMH includes CHF, asthma, HTN, DM2, ESRD (HD MWF), gout.   Clinical Impression   Pt admitted with above. She demonstrates the below listed deficits.  Pt presents to OT with generalized weakness, decreased activity tolerance, back pain, and ? Cognitive impairment (unsure of baseline).  She is able to perform ADLs with supervision/set up.  She was instructed in energy conservation strategies, and reports she is going to acquire a tub seat for home use, and that daughter will assist intermittently.  Reinforced need to use rollator at home.  Pt discharging home today.  Acute OT will sign off. .      Follow Up Recommendations  Home health OT;Supervision - Intermittent (Pt states she's "not having that" re: 24 hour supervision.)    Equipment Recommendations  None recommended by OT    Recommendations for Other Services       Precautions / Restrictions Precautions Precautions: Fall;Other (comment) Precaution Comments: Pt denies falls at home, but reports she fatigues quickly      Mobility Bed Mobility               General bed mobility comments: Pt seated EOB    Transfers Overall transfer level: Needs assistance Equipment used: Straight cane Transfers: Sit to/from Stand;Stand Pivot Transfers Sit to Stand: Supervision Stand pivot transfers: Supervision       General transfer comment: Pt avoidant of using rollator despite encouragement to use it    Balance Overall balance assessment: Needs assistance   Sitting balance-Leahy Scale: Good     Standing balance support: During functional activity Standing balance-Leahy Scale: Fair Standing balance comment: maintains static standing with  supervision                           ADL either performed or assessed with clinical judgement   ADL Overall ADL's : Needs assistance/impaired Eating/Feeding: Independent   Grooming: Wash/dry hands;Wash/dry face;Oral care;Min guard;Standing Grooming Details (indicate cue type and reason): requires seated rest break when brushing teeth Upper Body Bathing: Set up;Supervision/ safety;Sitting   Lower Body Bathing: Supervison/ safety;Set up;Sit to/from stand   Upper Body Dressing : Set up;Sitting   Lower Body Dressing: Supervision/safety;Sit to/from stand   Toilet Transfer: Supervision/safety;Ambulation;Comfort height toilet   Toileting- Clothing Manipulation and Hygiene: Supervision/safety;Sit to/from stand     Tub/Shower Transfer Details (indicate cue type and reason): discussed need to sit to shower with pt.  She reports she will acquire a shower seat Functional mobility during ADLs: Supervision/safety;Cane General ADL Comments: discussed use of rollator at home to provide use of seat when she fatigues.  SHe verbalized understanding, but avoids using, or practicing with it during OT eval.  Reviewed energy conservation strategies with pt     Vision         Perception     Praxis      Pertinent Vitals/Pain Pain Assessment: Faces Faces Pain Scale: Hurts little more Pain Location: back Pain Descriptors / Indicators: Constant Pain Intervention(s): Limited activity within patient's tolerance;Monitored during session     Hand Dominance Right   Extremity/Trunk Assessment Upper Extremity Assessment Upper Extremity Assessment: Overall WFL for tasks assessed   Lower Extremity Assessment Lower Extremity Assessment: Overall WFL for tasks assessed  Cervical / Trunk Assessment Cervical / Trunk Assessment: Normal   Communication Communication Communication: No difficulties   Cognition Arousal/Alertness: Awake/alert Behavior During Therapy: WFL for tasks  assessed/performed Overall Cognitive Status: No family/caregiver present to determine baseline cognitive functioning                                 General Comments: Unsure of pt's baseline.  She self distracts.  She demonstrates impaired safety awareness, as well as impaired orgainization and problem solving   General Comments  Spo2 96-100% on RA.  DOE 3/4.  HR 93 with activity    Exercises     Shoulder Instructions      Home Living Family/patient expects to be discharged to:: Private residence Living Arrangements: Alone Available Help at Discharge: Family;Available PRN/intermittently Type of Home: Apartment Home Access: Stairs to enter Entrance Stairs-Number of Steps: 3 Entrance Stairs-Rails: None Home Layout: One level     Bathroom Shower/Tub: Occupational psychologist: Standard Bathroom Accessibility: Yes   Home Equipment: Environmental consultant - 4 wheels;Kasandra Knudsen - single point   Additional Comments: Daughter lives nearby, but busy with family of her own.  Pt reports she has a shower seat that "isn't working right", and plans to acquire a new one      Prior Functioning/Environment Level of Independence: Independent with assistive device(s)        Comments: Mod indep household ambulator with rollator or SPC, uses SPC on steps into home; more limited recently by SOB and fatigue with ADL tasks- reports she typically has to take frequent rest breaks.  Uses transportation to HD/appts; daughter typically picks up groceries        OT Problem List: Decreased strength;Decreased activity tolerance;Impaired balance (sitting and/or standing);Decreased cognition;Decreased safety awareness;Decreased knowledge of use of DME or AE;Pain      OT Treatment/Interventions:      OT Goals(Current goals can be found in the care plan section) Acute Rehab OT Goals Patient Stated Goal: To go home soon OT Goal Formulation: All assessment and education complete, DC therapy  OT  Frequency:     Barriers to D/C:            Co-evaluation              AM-PAC OT "6 Clicks" Daily Activity     Outcome Measure Help from another person eating meals?: None Help from another person taking care of personal grooming?: A Little Help from another person toileting, which includes using toliet, bedpan, or urinal?: A Little Help from another person bathing (including washing, rinsing, drying)?: A Little Help from another person to put on and taking off regular upper body clothing?: A Little Help from another person to put on and taking off regular lower body clothing?: A Little 6 Click Score: 19   End of Session Equipment Utilized During Treatment: Rolling walker Nurse Communication: Mobility status  Activity Tolerance: Patient limited by fatigue Patient left: in bed;with call bell/phone within reach  OT Visit Diagnosis: Unsteadiness on feet (R26.81);Pain Pain - part of body:  (back)                Time: [id number] OT Time Calculation (min): 34 min Charges:  OT General Charges $OT Visit: 1 Visit OT Evaluation $OT Eval Moderate Complexity: 1 Mod OT Treatments $Self Care/Home Management : 8-22 mins  Nilsa Nutting., OTR/L Acute Rehabilitation Services Pager [phone] Office [phone]   Lucille Passy  M 12/21/2020, 11:17 AM

## 2020-12-21 NOTE — TOC Transition Note (Addendum)
Transition of Care [facility]) - CM/SW Discharge Note   Patient Details  Name: Julie [name] MRN: [id number] Date of Birth: 11-Jun-1956  [facility] CM/SW Contact:  Carles Collet, RN Phone Number: 12/21/2020, 8:51 AM   Clinical Narrative:    Spoke w patient, she is agreeable to [facility] services, does not have preference for provider. She understands the [facility] agency will call her after DC to set up a time to come outt to her house for their first appointment. CM will arrange HH, agency may be assigned after DC.  No DME needs.   Patient accepted by [facility] after DC.      Final next level of care: [place] Barriers to Discharge: No Barriers Identified   Patient Goals and CMS Choice Patient states their goals for this hospitalization and ongoing recovery are:: to go home CMS Medicare.gov Compare Post Acute Care list provided to:: Patient Choice offered to / list presented to : Patient  Discharge Placement                       Discharge Plan and Services                DME Arranged: N/A         HH Arranged: PT,Nurse's Aide          Social Determinants of Health (SDOH) Interventions     Readmission Risk Interventions No flowsheet data found.

## 2020-12-21 NOTE — Progress Notes (Signed)
Patient given discharge AVS summary. Education on medications and special instructions complete. Patient has no additional questions at this time.

## 2020-12-21 NOTE — Progress Notes (Signed)
FPTS Interim Progress Note  Spoke with patient's daughter, Adonis Huguenin, via phone. Informed her that her mother is medically stable for discharge at this time. Her daughter is unable to provide transportation tonight but will pick Ms. Highland up tomorrow morning ~6:30 in time to make her outpatient dialysis at [facility] assistance from renal navigator in arranging outpatient COVID dialysis (MWF 8am shift).   Alcus Dad, MD 12/21/2020, 8:42 AM PGY-1, [facility] Service pager [phone]

## 2020-12-21 NOTE — Progress Notes (Signed)
8:37am-CSW received call from MD to assist with getting patient to outpatient dialysis at [facility]. CSW contacted [place]; they transferred me to [facility] where patient is going for her COVID shift at [place] left a voicemail. CSW then called the center again directly and was told that patient can still get dialysis as long as she is coming by 1pm. Per MD, they spoke with patient's daughter and she is planning on picking patient up by 1pm to take her there and back home.   Milagro Belmares LCSW

## 2020-12-22 LAB — CULTURE, BLOOD (ROUTINE X 2)
Culture: NO GROWTH
Culture: NO GROWTH

## 2021-01-05 DIAGNOSIS — Z992 Dependence on renal dialysis: Secondary | ICD-10-CM | POA: Insufficient documentation

## 2021-02-05 ENCOUNTER — Inpatient Hospital Stay (HOSPITAL_COMMUNITY)
Admission: EM | Admit: 2021-02-05 | Discharge: 2021-02-07 | DRG: 640 | Disposition: A | Discharge status: Home / Self Care | Payer: Medicare Other | Attending: Internal Medicine | Admitting: Internal Medicine

## 2021-02-05 ENCOUNTER — Emergency Department (HOSPITAL_COMMUNITY): Payer: Medicare Other

## 2021-02-05 ENCOUNTER — Encounter (HOSPITAL_COMMUNITY): Payer: Self-pay | Admitting: Emergency Medicine

## 2021-02-05 ENCOUNTER — Other Ambulatory Visit: Payer: Self-pay

## 2021-02-05 DIAGNOSIS — Z6841 Body Mass Index (BMI) 40.0 and over, adult: Secondary | ICD-10-CM | POA: Diagnosis not present

## 2021-02-05 DIAGNOSIS — I5032 Chronic diastolic (congestive) heart failure: Secondary | ICD-10-CM | POA: Diagnosis present

## 2021-02-05 DIAGNOSIS — I132 Hypertensive heart and chronic kidney disease with heart failure and with stage 5 chronic kidney disease, or end stage renal disease: Secondary | ICD-10-CM | POA: Diagnosis present

## 2021-02-05 DIAGNOSIS — E1122 Type 2 diabetes mellitus with diabetic chronic kidney disease: Secondary | ICD-10-CM | POA: Diagnosis present

## 2021-02-05 DIAGNOSIS — M1A9XX0 Chronic gout, unspecified, without tophus (tophi): Secondary | ICD-10-CM | POA: Diagnosis present

## 2021-02-05 DIAGNOSIS — Z992 Dependence on renal dialysis: Secondary | ICD-10-CM

## 2021-02-05 DIAGNOSIS — I272 Pulmonary hypertension, unspecified: Secondary | ICD-10-CM | POA: Diagnosis present

## 2021-02-05 DIAGNOSIS — J810 Acute pulmonary edema: Secondary | ICD-10-CM

## 2021-02-05 DIAGNOSIS — Z87891 Personal history of nicotine dependence: Secondary | ICD-10-CM

## 2021-02-05 DIAGNOSIS — D631 Anemia in chronic kidney disease: Secondary | ICD-10-CM | POA: Diagnosis present

## 2021-02-05 DIAGNOSIS — Z823 Family history of stroke: Secondary | ICD-10-CM

## 2021-02-05 DIAGNOSIS — K219 Gastro-esophageal reflux disease without esophagitis: Secondary | ICD-10-CM | POA: Diagnosis present

## 2021-02-05 DIAGNOSIS — N186 End stage renal disease: Secondary | ICD-10-CM | POA: Diagnosis present

## 2021-02-05 DIAGNOSIS — Z20822 Contact with and (suspected) exposure to covid-19: Secondary | ICD-10-CM | POA: Diagnosis present

## 2021-02-05 DIAGNOSIS — R0602 Shortness of breath: Secondary | ICD-10-CM

## 2021-02-05 DIAGNOSIS — E8770 Fluid overload, unspecified: Principal | ICD-10-CM | POA: Diagnosis present

## 2021-02-05 DIAGNOSIS — Z7984 Long term (current) use of oral hypoglycemic drugs: Secondary | ICD-10-CM | POA: Diagnosis not present

## 2021-02-05 DIAGNOSIS — G4733 Obstructive sleep apnea (adult) (pediatric): Secondary | ICD-10-CM | POA: Diagnosis present

## 2021-02-05 DIAGNOSIS — Z79899 Other long term (current) drug therapy: Secondary | ICD-10-CM | POA: Diagnosis not present

## 2021-02-05 DIAGNOSIS — Z7982 Long term (current) use of aspirin: Secondary | ICD-10-CM

## 2021-02-05 DIAGNOSIS — I480 Paroxysmal atrial fibrillation: Secondary | ICD-10-CM | POA: Diagnosis present

## 2021-02-05 DIAGNOSIS — R531 Weakness: Secondary | ICD-10-CM | POA: Diagnosis not present

## 2021-02-05 DIAGNOSIS — E1140 Type 2 diabetes mellitus with diabetic neuropathy, unspecified: Secondary | ICD-10-CM | POA: Diagnosis present

## 2021-02-05 DIAGNOSIS — R29898 Other symptoms and signs involving the musculoskeletal system: Secondary | ICD-10-CM | POA: Diagnosis present

## 2021-02-05 DIAGNOSIS — Z888 Allergy status to other drugs, medicaments and biological substances status: Secondary | ICD-10-CM

## 2021-02-05 DIAGNOSIS — M48061 Spinal stenosis, lumbar region without neurogenic claudication: Secondary | ICD-10-CM | POA: Diagnosis present

## 2021-02-05 DIAGNOSIS — E785 Hyperlipidemia, unspecified: Secondary | ICD-10-CM | POA: Diagnosis present

## 2021-02-05 DIAGNOSIS — J449 Chronic obstructive pulmonary disease, unspecified: Secondary | ICD-10-CM | POA: Diagnosis present

## 2021-02-05 DIAGNOSIS — I120 Hypertensive chronic kidney disease with stage 5 chronic kidney disease or end stage renal disease: Secondary | ICD-10-CM | POA: Diagnosis not present

## 2021-02-05 LAB — CBC
HCT: 25.6 % — ABNORMAL LOW (ref 36.0–46.0)
Hemoglobin: 8.2 g/dL — ABNORMAL LOW (ref 12.0–15.0)
MCH: 29.5 pg (ref 26.0–34.0)
MCHC: 32 g/dL (ref 30.0–36.0)
MCV: 92.1 fL (ref 80.0–100.0)
Platelets: 231 10*3/uL (ref 150–400)
RBC: 2.78 MIL/uL — ABNORMAL LOW (ref 3.87–5.11)
RDW: 14.8 % (ref 11.5–15.5)
WBC: 5.4 10*3/uL (ref 4.0–10.5)
nRBC: 0 % (ref 0.0–0.2)

## 2021-02-05 LAB — BASIC METABOLIC PANEL
Anion gap: 11 (ref 5–15)
BUN: 31 mg/dL — ABNORMAL HIGH (ref 8–23)
CO2: 24 mmol/L (ref 22–32)
Calcium: 9.2 mg/dL (ref 8.9–10.3)
Chloride: 104 mmol/L (ref 98–111)
Creatinine, Ser: 9.38 mg/dL — ABNORMAL HIGH (ref 0.44–1.00)
GFR, Estimated: 4 mL/min — ABNORMAL LOW (ref >60)
Glucose, Bld: 142 mg/dL — ABNORMAL HIGH (ref 70–99)
Potassium: 3.8 mmol/L (ref 3.5–5.1)
Sodium: 139 mmol/L (ref 135–145)

## 2021-02-05 LAB — I-STAT CHEM 8, ED
BUN: 33 mg/dL — ABNORMAL HIGH (ref 8–23)
Calcium, Ion: 1.09 mmol/L — ABNORMAL LOW (ref 1.15–1.40)
Chloride: 103 mmol/L (ref 98–111)
Creatinine, Ser: 10 mg/dL — ABNORMAL HIGH (ref 0.44–1.00)
Glucose, Bld: 111 mg/dL — ABNORMAL HIGH (ref 70–99)
HCT: 22 % — ABNORMAL LOW (ref 36.0–46.0)
Hemoglobin: 7.5 g/dL — ABNORMAL LOW (ref 12.0–15.0)
Potassium: 3.9 mmol/L (ref 3.5–5.1)
Sodium: 141 mmol/L (ref 135–145)
TCO2: 24 mmol/L (ref 22–32)

## 2021-02-05 LAB — TROPONIN I (HIGH SENSITIVITY)
Troponin I (High Sensitivity): 20 ng/L — ABNORMAL HIGH (ref <18)
Troponin I (High Sensitivity): 18 ng/L — ABNORMAL HIGH (ref <18)

## 2021-02-05 LAB — BRAIN NATRIURETIC PEPTIDE: B Natriuretic Peptide: 457.4 pg/mL — ABNORMAL HIGH (ref 0.0–100.0)

## 2021-02-05 LAB — RESP PANEL BY RT-PCR (FLU A&B, COVID) ARPGX2
Influenza A by PCR: NEGATIVE
Influenza B by PCR: NEGATIVE
SARS Coronavirus 2 by RT PCR: NEGATIVE

## 2021-02-05 LAB — GLUCOSE, CAPILLARY
Glucose-Capillary: 85 mg/dL (ref 70–99)
Glucose-Capillary: 166 mg/dL — ABNORMAL HIGH (ref 70–99)

## 2021-02-05 MED ORDER — ASPIRIN EC 81 MG PO TBEC
81.0000 mg | DELAYED_RELEASE_TABLET | Freq: Every day | ORAL | Status: DC
  Administered 2021-02-06 – 2021-02-07 (×2): 81 mg via ORAL
  Filled 2021-02-05 (×2): qty 1

## 2021-02-05 MED ORDER — ACETAMINOPHEN 325 MG PO TABS
650.0000 mg | ORAL_TABLET | Freq: Once | ORAL | Status: AC
  Administered 2021-02-05: 650 mg via ORAL

## 2021-02-05 MED ORDER — HYDROXYZINE HCL 10 MG PO TABS: 5.0000 mg | ORAL_TABLET | Freq: Every day | ORAL | Status: DC | PRN

## 2021-02-05 MED ORDER — CHLORHEXIDINE GLUCONATE CLOTH 2 % EX PADS: 6.0000 | MEDICATED_PAD | Freq: Every day | CUTANEOUS | Status: DC

## 2021-02-05 MED ORDER — HEPARIN SODIUM (PORCINE) 5000 UNIT/ML IJ SOLN
5000.0000 [IU] | Freq: Three times a day (TID) | INTRAMUSCULAR | Status: DC
  Administered 2021-02-05: 5000 [IU] via SUBCUTANEOUS

## 2021-02-05 MED ORDER — INSULIN ASPART 100 UNIT/ML ~~LOC~~ SOLN: 0.0000 [IU] | Freq: Every day | SUBCUTANEOUS | Status: DC

## 2021-02-05 MED ORDER — PANTOPRAZOLE SODIUM 40 MG PO TBEC
40.0000 mg | DELAYED_RELEASE_TABLET | Freq: Every day | ORAL | Status: DC
  Administered 2021-02-06 – 2021-02-07 (×2): 40 mg via ORAL
  Filled 2021-02-05 (×2): qty 1

## 2021-02-05 MED ORDER — PRAVASTATIN SODIUM 10 MG PO TABS
5.0000 mg | ORAL_TABLET | Freq: Every day | ORAL | Status: DC
  Administered 2021-02-06 – 2021-02-07 (×2): 5 mg via ORAL
  Filled 2021-02-05 (×2): qty 1

## 2021-02-05 MED ORDER — FUROSEMIDE 10 MG/ML IJ SOLN
40.0000 mg | Freq: Once | INTRAMUSCULAR | Status: AC
  Administered 2021-02-05: 40 mg via INTRAVENOUS
  Filled 2021-02-05: qty 4

## 2021-02-05 MED ORDER — CYCLOSPORINE 0.05 % OP EMUL
1.0000 [drp] | Freq: Two times a day (BID) | OPHTHALMIC | Status: DC
  Administered 2021-02-06 – 2021-02-07 (×3): 1 [drp] via OPHTHALMIC
  Filled 2021-02-05 (×5): qty 1

## 2021-02-05 MED ORDER — ACETAMINOPHEN 325 MG PO TABS
ORAL_TABLET | ORAL | Status: AC
  Filled 2021-02-05: qty 2

## 2021-02-05 MED ORDER — INSULIN ASPART 100 UNIT/ML ~~LOC~~ SOLN: 0.0000 [IU] | Freq: Three times a day (TID) | SUBCUTANEOUS | Status: DC

## 2021-02-05 MED ORDER — ALBUTEROL SULFATE HFA 108 (90 BASE) MCG/ACT IN AERS
2.0000 | INHALATION_SPRAY | RESPIRATORY_TRACT | Status: DC | PRN
  Filled 2021-02-05: qty 6.7

## 2021-02-05 MED ORDER — APIXABAN 5 MG PO TABS
5.0000 mg | ORAL_TABLET | Freq: Two times a day (BID) | ORAL | Status: DC
  Administered 2021-02-06 – 2021-02-07 (×3): 5 mg via ORAL
  Filled 2021-02-05 (×3): qty 1

## 2021-02-05 MED ORDER — IOHEXOL 350 MG/ML SOLN
75.0000 mL | Freq: Once | INTRAVENOUS | Status: AC | PRN
  Administered 2021-02-05: 75 mL via INTRAVENOUS

## 2021-02-05 NOTE — H&P (Signed)
Date: 02/05/2021               Patient Name:  Julie [name] MRN: [id number]  DOB: [birth year] Age / Sex: 65 y.o., female   PCP: Antony Salmon, MD         Medical Service: Internal Medicine Teaching Service         Attending Physician: Dr. Dorian Pod, MD    First Contact: Dr. Foy Guadalajara, MD Pager: [phone]  Second Contact: Dr. Tamsen Snider, MD Pager: [phone]       After Hours (After 5p/  First Contact Pager: [phone]  weekends / holidays): Second Contact Pager: [phone]   Chief Complaint: Leg weakness  History of Present Illness: Julie [name] is a 65 year old woman with past medical history significant for ESRD (on HD MWF), diastolic congestive heart failure, OSA (on CPAP), asthma, prior tobacco use, obesity (BMI >40), paroxysmal atrial fibrillation (on Eliquis), spinal stenosis of lumbar region, T2DM, gout, HTN, and recent hospitalization for acute hypoxic respiratory failure in the setting of COVID-19 infection (12/17/20 to 12/21/20) who presented to [facility] on 02/05/21 for evaluation of leg weakness.  Patient reports that since her diagnosis of COVID-19 in the beginning of February, she has been experiencing bilateral lower extremity weakness and difficulty with ambulation which was a complaint present on her prior admission. She states that her symptoms have been particularly bothersome over the past four days. She has been having difficulty ambulating around her house and feels that her legs are going to give out when ambulating from her couch to her bathroom or her bed to her bathroom. She was recommended to ambulate with a walker, however the device is broken and she has been using a cane to ambulate instead. She has been spending her days on her couch or in bed due to a fear of her leg weakness. She states that she has been needing to wash herself at her sink as she does not feel safe getting into her shower. She has been unable to cook for herself and has resorted to eating pre-made  meals to prevent herself from needing to stand to put together a meal. Patient lives alone and previously had support from her daughter, however her daughter is busy with her multiple children and only stops by to drop off prescription medications and groceries. She has no other support or visitors to her apartment. She states that she has not experienced any falls due to her leg weakness. She denies shortness of breath with exertion or at rest, orthopnea, cough, incontinence of stool or urine, or weakness in her upper extremities. Additionally, patient reports that she is overwhelmed with her medications and does not know which medications that she needs to take. She reads the adverse effect information about her blood pressure medications and is concerned about taking her antihypertensives.  ED Course: On arrival to the ED, patient was hypertensive (170/77), tachypneic (26), saturating at 99% on room air. EKG revealed normal sinus rhythm with TWI in inferolateral leads. Initial labs with CBC revealing Hb 8.2 (baseline of ~7-8), MCV 92.1, RDW 14.8; BMP revealing Glu 142, BUN 31, Cr 9.38; Troponin 20 then 18; BNP 457.4. CXR revealed cardiac enlargement with pulmonary vascular congestion and small pleural effusions; CTA Chest without evidence of pulmonary emboli, small right pleural effusion and mild right basilar atelectasis, cardiomegaly with very small pericardial effusion (unchanged), small hiatal hernia. Patient received '40mg'$  IV furosemide and nephrology was consulted. IMTS contacted for admission.  Medications: Acetaminophen 500-'1000mg'$  daily  PRN Allopurinol '100mg'$  daily Apixaban '5mg'$  twice daily Aspirin '81mg'$  daily Calcium carbonate '500mg'$  three times daily with meals Cetirizine '10mg'$  daily Cyclosporine eye drops twice daily Diltiazem '120mg'$  daily Fluticasone 35mg 2 sprays daily Furosemide '80mg'$  daily Gabapentin '100mg'$  daily Glipizide '5mg'$  daily Hydralazine '25mg'$  three times daily Hydroxyzine 5-'10mg'$   daily PRN Losartan '25mg'$  twice daily Melatonin '5mg'$  daily Nifedipine '90mg'$  daily Nitroglycerin 0.'4mg'$  every five minutes PRN Omeprazole '20mg'$  twice daily Pravastatin '5mg'$  daily Proair 1 puff every six hours Valacyclovir '500mg'$  daily for three days at first sign of outbreak  Allergies: Allergies as of 02/05/2021 - Review Complete 12/19/2020  Allergen Reaction Noted  . Iron Other (See Comments) 02/04/2020  . Atorvastatin Other (See Comments) 02/10/2020  . Carvedilol Other (See Comments) 04/13/2020  . Metoprolol Other (See Comments) 02/10/2020  . Rosuvastatin Other (See Comments) 02/10/2020   Past Medical History: ESRD 2/2 HTN on HD MWF Diastolic congestive heart failure Obstructive sleep apnea on CPAP Asthma Prior tobacco use (38-pack years) Obesity (BMI >40) Paroxysmal atrial fibrillation on Eliquis Spinal stenosis of lumbar region Non-insulin dependent type 2 diabetes mellitus Gout  HTN  Past Surgical History: Abdominal Hysterectomy  Family History: Family History  Problem Relation Age of Onset  . Stroke Mother    Social History:  [place] Patient's primary care physician is Dr[facility]son Landry[facility] [place] daughter [place] three sons who live approximately an hour away. She has assistance with groceries and delivery of her prescription medications by her daughter, but otherwise has no other social support. She talks to her daughter most days on the phone, however her daughter doesn't visit often. Patient is responsible for her activities of daily living at home, however she has difficulty performing them due to her lower extremity weakness. Patient previously smoked cigarettes (1 pack per day for 38 years). She does not use alcohol or drugs.  Review of Systems: A complete ROS was negative except as per HPI.   Physical Exam: Blood pressure (!) 174/104, pulse 89, temperature 99 F (37.2 C), resp. rate  20, SpO2 96 %. Physical Exam Vitals reviewed.  Constitutional:      General: She is not in acute distress.    Appearance: She is obese.  Neck:     Comments: Unable to assess JVD due to body habitus Cardiovascular:     Rate and Rhythm: Normal rate and regular rhythm.     Heart sounds: Normal heart sounds.  Pulmonary:     Comments: Decreased breath sounds. Clear to auscultation bilaterally Abdominal:     General: Bowel sounds are normal.     Palpations: Abdomen is soft.     Tenderness: There is no abdominal tenderness.  Musculoskeletal:        General: Normal range of motion.     Cervical back: Normal range of motion and neck supple.     Right lower leg: Edema present.     Left lower leg: Edema present.     Comments: 1+ pitting edema to bilateral knees. Left upper extremity AV fistula with bruit.   Skin:    General: Skin is warm and dry.     Capillary Refill: Capillary refill takes less than 2 seconds.  Neurological:     General: No focal deficit present.     Mental Status: She is alert and oriented to person, place, and time.     Motor: Weakness present.     Comments:  Gait deferred  Feet: 5/5 strength with dorsiflexion, plantar flexion, inversion and eversion of the foot. Intact sensation to light touch and vibration. Right hip: 4/5 strength with flexion, 5/5 strength with extension Left hip: 4/5 strength with flexion, 5/5 strength with extension  Psychiatric:        Mood and Affect: Mood normal.        Behavior: Behavior normal.    EKG: personally reviewed my interpretation is normal sinus rhythm with TWI in inferolateral leads  CXR: personally reviewed my interpretation is cardiac enlargement with pulmonary vascular congestion and small pleural effusions  Assessment & Plan by Problem: Active Problems:   Leg weakness, bilateral  Jazmia Vandijk is a 65 year old woman with past medical history significant for ESRD (on HD MWF), diastolic congestive heart failure, OSA (on  CPAP), asthma, atrial fibrillation (on Eliquis), spinal stenosis of lumbar region, T2DM, gout, HTN, and recent hospitalization for acute hypoxic respiratory failure 2/2 COVID-19 infection (12/17/20 to 12/21/20) who presented to [facility] on 02/05/21 for evaluation of bilateral lower extremity weakness found to have volume overload.  #ESRD (on HD MWF), chronic Patient follows closely with hemodialysis clinic at [facility]. Patient has been regularly attending her hemodialysis sessions, however she missed dialysis on Wednesday of this past week. On examination, patient has evidence of volume overload with bilateral pitting edema of the lower extremities. Additionally, patient's CXR revealed small right pleural effusion. Nephrology was consulted with plans for hemodialysis tonight and tomorrow. Patient does not have documented dry weight in our system, Dr. Jonnie Finner has requested records from outpatient unit. -Nephrology managing  -Dialysis tonight and tomorrow  -Strict intake and output  -Daily weights  #Bilateral lower extremity weakness, active #Spinal stenosis of lumbar region, chronic Patient reports significant bilateral lower extremity weakness and difficulty with gait that is greatly affecting her ability to perform her activities of daily living. Patient has minimal support at home and spends most days completely alone. Prior to discharge at last hospitalization, patient was recommended to have home health services (PT and OT), a rollator walker and tub seat. -PT eval and treat -OT eval and treat -TSH  #Hypertension, chronic Patient hypertensive to [id number] systolic on arrival. Patient reports that she has not taken any of her antihypertensive medications today and does not take them regularly at home due to concern for adverse effects. -Hold antihypertensive medications today -Restart home medications as tolerated following hemodialysis  -Losartan '25mg'$  twice daily, nifedipine '90mg'$  daily, hydralazine  '25mg'$  three times daily  #Paroxysmal atrial fibrillation, chronic Patient found to have new onset atrial fibrillation in January. Patient wore Zio Patch following most recent hospitalization and found to have continued paroxysms of atrial fibrillation despite no subjective reports of palpitations. Patient in normal sinus rhythm with rate of 80s-90s since admission. Patient has not taken diltiazem today and reports not regularly taking her Eliquis. -Continue home diltiazem '120mg'$  daily -Continue home apixaban '5mg'$  twice daily  #Type 2 diabetes mellitus, chronic Patient's last hemoglobin A1c was 5.8. She is prescribed glipizide '5mg'$  daily, however her adherence is unknown. -Repeat HbA1c  -Very sensitive SSI -CBG monitoring 4 times daily  #Obstructive sleep apnea, chronic #Obesity, chronic Patient has CPAP at home for her OSA. Uncontrolled sleep apnea likely contributing to her poorly controlled hypertension. -CPAP nightly  #Diastolic congestive heart failure, chronic Patient has evidence of volume overload on examination. -Dialysis per nephrology, tonight and tomorrow -Furosemide '80mg'$  daily (on Tuesday, Thursday, Saturday and Sunday)  #Anemia of chronic kidney disease, chronic Patient's hemoglobin 8.2 on admission. Patient's hemoglobin  appears to be 7-8 at baseline. -Continue management per nephrology  #GERD, chronic -Start pantoprazole '40mg'$  daily  #HLD, chronic -Continue home pravastatin '5mg'$  daily  #Gout, chronic -Holding home allopurinol '100mg'$  daily  #VTE ppx: Apixaban '5mg'$  twice daily #IVF: None #Diet: Renal with fluid restriction #Code status: Full Code  Dispo: Admit patient to Inpatient with expected length of stay greater than 2 midnights.  Signed: Cato Mulligan, MD 02/05/2021, 6:11 PM  Pager: [phone] After 5pm on weekdays and 1pm on weekends: On Call pager: [phone]

## 2021-02-05 NOTE — ED Provider Notes (Signed)
[facility] EMERGENCY DEPARTMENT Provider Note   CSN: [id number] Arrival date & time: 02/05/21  1151     History Chief Complaint  Patient presents with  . Chest Pain  . Shortness of Breath  . Weakness    Julie [name] is a 65 y.o. female who presents emergency department with chief complaint of bilateral leg weakness and shortness of breath.  She has a past medical history of end-stage renal disease on hemodialysis Monday Wednesday Friday with last full dialysis 2 days ago.  She has a history of CHF, hypertension and recent hospitalization with respiratory failure due to COVID-19 infection.  Patient states that she has experienced progressively worsening bilateral lower extremity weakness.  She says that she uses a cane but her legs have been buckling underneath her.  She denies any new significant lower extremity edema or paresthesia.  Patient also complains that she has had improving respiratory status from her discharge.  She has oxygen at home and uses it as needed however for the past 4 days she has had severe dyspnea both at rest and much worse with exertion.  Normally she can get around her house with out any significant difficulty however now she can barely get from her bed to her bathroom without gasping for air.  She denies orthopnea or PND.  She does not have any chest pain.  She is tremulous but states that she thinks that is because she feels cold.  She does make urine.  She was recently prescribed labetalol and substitution for her losartan.  She felt as if the losartan was making her throat feels swollen and make her feel as if she were choked at night.  She does use a CPAP machine.  She also has not taken her metoprolol in the last 2 days because she was afraid that her current symptoms of weakness, shortness of breath could be related to the new medication.  HPI     Past Medical History:  Diagnosis Date  . Asthma   . CHF (congestive heart failure) ([place])    . Gout   . Hypertension   . Neuropathy   . Plantar fasciitis   . Renal disorder     Patient Active Problem List   Diagnosis Date Noted  . Pneumonia due to COVID-19 virus 12/17/2020  . Stiffness of joint, not elsewhere classified, pelvic region and thigh 06/01/2014  . Lumbago 06/01/2014  . Muscle weakness (generalized) 06/01/2014  . Abnormality of gait 06/01/2014  . Spinal stenosis of lumbar region 07/16/2013  . LBBB (left bundle branch block) 01/15/2013  . HTN (hypertension) 01/15/2013  . Diastolic CHF, chronic ([place]) 01/15/2013    Past Surgical History:  Procedure Laterality Date  . ABDOMINAL HYSTERECTOMY       OB History    Gravida  6   Para  4   Term  4   Preterm      AB  2   Living  4     SAB  2   IAB      Ectopic      Multiple      Live Births              Family History  Problem Relation Age of Onset  . Stroke Mother     Social History   Tobacco Use  . Smoking status: Former Smoker    Packs/day: 1.00    Years: 38.00    Pack years: 38.00    Types: Cigarettes  Quit date: 08/18/2004    Years since quitting: 16.4  . Smokeless tobacco: Never Used  Substance Use Topics  . Alcohol use: No  . Drug use: No    Home Medications Prior to Admission medications   Medication Sig Start Date End Date Taking? Authorizing Provider  acetaminophen (TYLENOL) 500 MG tablet Take 500-1,000 mg by mouth daily as needed for pain.    [provider]  albuterol (PROVENTIL HFA;VENTOLIN HFA) 108 (90 BASE) MCG/ACT inhaler Inhale 2 puffs into the lungs every 4 (four) hours as needed for wheezing. 01/05/13   Evelina Bucy, MD  allopurinol (ZYLOPRIM) 100 MG tablet Take 100 mg by mouth daily. 10/21/18   [provider]  aspirin EC 81 MG tablet Take 81 mg by mouth daily.    [provider]  calcium carbonate (OS-CAL) 1250 (500 Ca) MG chewable tablet Chew 2 tablets by mouth 3 (three) times daily with meals. 11/16/20   [provider]   cycloSPORINE (RESTASIS) 0.05 % ophthalmic emulsion Place 1 drop into both eyes in the morning and at bedtime. 09/03/18   [provider]  diltiazem (CARDIZEM CD) 120 MG 24 hr capsule Take 120 mg by mouth daily. 12/09/20   [provider]  ELIQUIS 5 MG TABS tablet Take 5 mg by mouth 2 (two) times daily. 11/16/20   [provider]  fluticasone (FLONASE) 50 MCG/ACT nasal spray Place 2 sprays into the nose daily.    [provider]  furosemide (LASIX) 80 MG tablet Take 80 mg by mouth See admin instructions. Taking on off dialysis days Harrell Lark, Sat, Sunday    [provider]  gabapentin (NEURONTIN) 100 MG capsule Take 100 mg by mouth daily. May take an additional 1 capsule four hours after dialysis. 08/16/20   [provider]  glipiZIDE (GLUCOTROL XL) 5 MG 24 hr tablet Take 5 mg by mouth daily.    [provider]  hydrALAZINE (APRESOLINE) 25 MG tablet Take 1 tablet (25 mg total) by mouth every 8 (eight) hours. 12/20/20   Lyndee Hensen, DO  hydrOXYzine (ATARAX/VISTARIL) 10 MG tablet Take 5-10 mg by mouth daily as needed for itching. 05/26/20   [provider]  losartan (COZAAR) 25 MG tablet Take 25 mg by mouth 2 (two) times daily. 11/23/20   [provider]  Melatonin 5 MG CAPS Take 5 mg by mouth daily.    [provider]  NIFEdipine (PROCARDIA XL/NIFEDICAL-XL) 90 MG 24 hr tablet Take 90 mg by mouth daily. 08/16/20   [provider]  nitroGLYCERIN (NITROSTAT) 0.4 MG SL tablet Place 0.4 mg under the tongue every 5 (five) minutes as needed for chest pain (1 tablet under tongue every 5 minutes as needed for chest pain).    Antony Salmon, MD  omeprazole (PRILOSEC) 20 MG capsule Take 20 mg by mouth 2 (two) times daily before a meal. 2 capsules, 2x/day    [provider]  pravastatin (PRAVACHOL) 10 MG tablet Take 5 mg by mouth daily. 02/10/20   [provider]    Allergies    Iron, Atorvastatin,  Carvedilol, Metoprolol, and Rosuvastatin  Review of Systems   Review of Systems Ten systems reviewed and are negative for acute change, except as noted in the HPI.   Physical Exam Updated Vital Signs BP (!) 170/77 (BP Location: Right Arm)   Pulse 95   Temp 99 F (37.2 C)   Resp (!) 26   SpO2 99%   Physical Exam Vitals and nursing  note reviewed.  Constitutional:      General: She is not in acute distress.    Appearance: She is well-developed. She is not diaphoretic.  HENT:     Head: Normocephalic and atraumatic.  Eyes:     General: No scleral icterus.    Conjunctiva/sclera: Conjunctivae normal.  Cardiovascular:     Rate and Rhythm: Normal rate and regular rhythm.     Heart sounds: Normal heart sounds. No murmur heard. No friction rub. No gallop.   Pulmonary:     Effort: Pulmonary effort is normal. Tachypnea present. No accessory muscle usage or prolonged expiration.     Breath sounds: Normal breath sounds and air entry. No decreased air movement. No decreased breath sounds, wheezing, rhonchi or rales.     Comments: Patient is extremely dyspneic at rest she is only able to speak in 2-3 word sentences.  I sat the patient upright and this did not change her breathing pattern. Abdominal:     General: Bowel sounds are normal. There is no distension.     Palpations: Abdomen is soft. There is no mass.     Tenderness: There is no abdominal tenderness. There is no guarding.  Musculoskeletal:     Cervical back: Normal range of motion.     Right lower leg: 1+ Edema present.     Left lower leg: 1+ Edema present.  Skin:    General: Skin is warm and dry.  Neurological:     Mental Status: She is alert and oriented to person, place, and time.  Psychiatric:        Behavior: Behavior normal.     ED Results / Procedures / Treatments   Labs (all labs ordered are listed, but only abnormal results are displayed) Labs Reviewed  BASIC METABOLIC PANEL  CBC  TROPONIN I (HIGH SENSITIVITY)     EKG EKG Interpretation  Date/Time:  'Sunday February 05 2021 11:56:55 EDT Ventricular Rate:  95 PR Interval:  186 QRS Duration: 138 QT Interval:  416 QTC Calculation: 522 R Axis:   107 Text Interpretation: Normal sinus rhythm Rightward axis Non-specific intra-ventricular conduction block Cannot rule out Septal infarct , age undetermined T wave abnormality, consider inferolateral ischemia Abnormal ECG slight ST depression, t wave inversion inferiorly, otherwise unchanged from prior EKG Confirmed by Belfi, Melanie ([id number]) on 02/05/2021 12:15:14 PM   Radiology No results found.  Procedures Procedures   Medications Ordered in ED Medications - No data to display  ED Course  I have reviewed the triage vital signs and the nursing notes.  Pertinent labs & imaging results that were available during my care of the patient were reviewed by me and considered in my medical decision making (see chart for details).    MDM Rules/Calculators/A&P                          64'$  year old female here with hypertension, shortness of breath, severe dyspnea at rest, history of end-stage renal disease.The emergent differential diagnosis for shortness of breath includes, but is not limited to, Pulmonary edema, bronchoconstriction, Pneumonia, Pulmonary embolism, Pneumotherax/ Hemothorax, Dysrythmia, ACS.  I ordered and reviewed labs which included CBC which shows baseline normocytic anemia, BMP with elevated creatinine and BUN consistent with history of end-stage renal disease, BNP is slightly elevated, Covid panel is negative, mildly elevated troponin in the setting of hypertension and end-stage renal disease likely does not reflect ACS.  I ordered and reviewed a CT angiogram of the chest  which is negative for acute abnormality.  I ordered and reviewed two-view chest and rib these images both are consistent with pulmonary edema.  EKG shows normal sinus rhythm at a rate of 95.  Case discussed with Dr. Jonnie Finner who  agrees that the patient needs dialysis.  Patient given Lasix here in the emergency department as she does still make urine.  Patient will be admitted for volume overload. Final Clinical Impression(s) / ED Diagnoses Final diagnoses:  None    Rx / DC Orders ED Discharge Orders    None       Margarita Mail, PA-C 02/06/21 2005    Malvin Johns, MD 02/07/21 [id number]

## 2021-02-05 NOTE — ED Triage Notes (Signed)
Pt reports SOB and bilateral leg weakness x 3 days.  Reports chest pain on Friday night that resolved.  States she was admitted for COVID 1 month ago.

## 2021-02-05 NOTE — ED Notes (Signed)
Attempted to give reportx1 

## 2021-02-05 NOTE — ED Notes (Signed)
Patient transported to CT 

## 2021-02-05 NOTE — Consult Note (Signed)
Renal Service Consult Note [facility]  Julie [name] 02/05/2021 Sol Blazing, MD Requesting Physician: Dr. Tamera Punt, ED [facility]  Reason for Consult: ESRD pt w/ SOB, orthopnea HPI: The patient is a 65 y.o. year-old w/ hx of ESRD on HD MWF in [place], HTN , NIDDM type 2, asthma, HFpEF, pHTN, atrial fib, OSA on CPAP, gout, COPD presented to ED w/ SOB and bilat LE weakness for 3 days. Some CP 2 nights ago, resolved. Was admitted here for [facility] in Feb 2022. SOB has worsened x 3-4 days and having difficulty moving around the house now due to [place]. No prod cough or fevers or CP. Recently her labetalol was changed to losartan. Pt having some throat symptoms she thought may be related to losartan. In ED CXR shows cardiac enlargement, pulmonary vascular congestion and small pleural effusions. CTA chest showed no PE. There are diffuse GG changes not mentioned in the formal reading. We are asked to see for dialysis.   Pt seen in ED.  She missed one HD session on Wed last week. Has LUE AVF, no recent issues or problems.  No orthopnea but main issue is DOE.  Has O2 at home, not helping. Mild ankle swelling. May be losing body wt per the patient. Coughing a lot w/ whitish phlegm coming up, no fever or chills or prod cough.   ROS  denies CP  no joint pain   no HA  no blurry vision  no rash  no diarrhea  no nausea/ vomitingr    Past Medical History  Past Medical History:  Diagnosis Date  . Asthma   . CHF (congestive heart failure) ([place])   . Gout   . Hypertension   . Neuropathy   . Plantar fasciitis   . Renal disorder    Past Surgical History  Past Surgical History:  Procedure Laterality Date  . ABDOMINAL HYSTERECTOMY     Family History  Family History  Problem Relation Age of Onset  . Stroke Mother    Social History  reports that she quit smoking about 16 years ago. Her smoking use included cigarettes. She has a 38.00 pack-year smoking history. She has never used  smokeless tobacco. She reports that she does not drink alcohol and does not use drugs. Allergies  Allergies  Allergen Reactions  . Iron Other (See Comments)    Tongue swelling  . Atorvastatin Other (See Comments)    Muscle cramps  . Carvedilol Other (See Comments)    alopecia  . Metoprolol Other (See Comments)    Hair thinning  . Rosuvastatin Other (See Comments)    Muscle cramps   Home medications Prior to Admission medications   Medication Sig Start Date End Date Taking? Authorizing Provider  acetaminophen (TYLENOL) 500 MG tablet Take 500-1,000 mg by mouth daily as needed for pain.    [provider]  albuterol (PROVENTIL HFA;VENTOLIN HFA) 108 (90 BASE) MCG/ACT inhaler Inhale 2 puffs into the lungs every 4 (four) hours as needed for wheezing. 01/05/13   Evelina Bucy, MD  allopurinol (ZYLOPRIM) 100 MG tablet Take 100 mg by mouth daily. 10/21/18   [provider]  aspirin EC 81 MG tablet Take 81 mg by mouth daily.    [provider]  calcium carbonate (OS-CAL) 1250 (500 Ca) MG chewable tablet Chew 2 tablets by mouth 3 (three) times daily with meals. 11/16/20   [provider]  cycloSPORINE (RESTASIS) 0.05 % ophthalmic emulsion Place 1 drop into both eyes in the morning  and at bedtime. 09/03/18   [provider]  diltiazem (CARDIZEM CD) 120 MG 24 hr capsule Take 120 mg by mouth daily. 12/09/20   [provider]  ELIQUIS 5 MG TABS tablet Take 5 mg by mouth 2 (two) times daily. 11/16/20   [provider]  fluticasone (FLONASE) 50 MCG/ACT nasal spray Place 2 sprays into the nose daily.    [provider]  furosemide (LASIX) 80 MG tablet Take 80 mg by mouth See admin instructions. Taking on off dialysis days Harrell Lark, Sat, Sunday    [provider]  gabapentin (NEURONTIN) 100 MG capsule Take 100 mg by mouth daily. May take an additional 1 capsule four hours after dialysis. 08/16/20   [provider]   glipiZIDE (GLUCOTROL XL) 5 MG 24 hr tablet Take 5 mg by mouth daily.    [provider]  hydrALAZINE (APRESOLINE) 25 MG tablet Take 1 tablet (25 mg total) by mouth every 8 (eight) hours. 12/20/20   Lyndee Hensen, DO  hydrOXYzine (ATARAX/VISTARIL) 10 MG tablet Take 5-10 mg by mouth daily as needed for itching. 05/26/20   [provider]  losartan (COZAAR) 25 MG tablet Take 25 mg by mouth 2 (two) times daily. 11/23/20   [provider]  Melatonin 5 MG CAPS Take 5 mg by mouth daily.    [provider]  NIFEdipine (PROCARDIA XL/NIFEDICAL-XL) 90 MG 24 hr tablet Take 90 mg by mouth daily. 08/16/20   [provider]  nitroGLYCERIN (NITROSTAT) 0.4 MG SL tablet Place 0.4 mg under the tongue every 5 (five) minutes as needed for chest pain (1 tablet under tongue every 5 minutes as needed for chest pain).    Antony Salmon, MD  omeprazole (PRILOSEC) 20 MG capsule Take 20 mg by mouth 2 (two) times daily before a meal. 2 capsules, 2x/day    [provider]  pravastatin (PRAVACHOL) 10 MG tablet Take 5 mg by mouth daily. 02/10/20   [provider]     Vitals:   02/05/21 1245 02/05/21 1315 02/05/21 1330 02/05/21 1500  BP: (!) 175/73 (!) 170/82 (!) 148/107 (!) 175/94  Pulse: 96 92 88 89  Resp: (!) 23 (!) 24 20 (!) 22  Temp:      SpO2: 98% 98% 95% 96%   Exam Gen alert, no distress No rash, cyanosis or gangrene Sclera anicteric, throat clear  +JVD Chest mostly clear, occasional basilar crackels, no wheezing RRR no MRG Abd soft ntnd no mass or ascites +bs obese GU normal MS no joint effusions or deformity Ext bilat 1+  pretib edema, no hip edema, no wounds or ulcers Neuro is alert, Ox 3 , nf  LUA AVF+bruit       Home meds:  - asa 81/ pravastatin 5 qd/ sl ntg prn/ eliquis 5 bid  - cardizem 120 qd/  lasix '80mg'$  nonhd days/ hydralazine 25 tid/ losartan 25 bid/ procardia cl 90 qd  - allopurinol 100 qd/ prilosec 20 bid  - eliquis 5 bid  -  neurontin 100 qd  - prn's/ vitamins/ supplements     OP HD: [facility] [place]  MWF  - details to follow   Assessment/ Plan: 1. Dyspnea - CXR/ CT chest and exam suggest vol overload / pulm edema is main issue. May be losing body wt. No signs or symptoms of PNA. Plan HD tonight and again tomorrow, get volume down. Get a good wt.  Get records/ dry wt from outpt unit tomorrow. Will follow.  2. ESRD - on HD x 30yr [place] MWF [place]  3. HTN - cont home meds, hold tonight until after HD 4. Atrial fib - per primary team 5. DM2 - on po medication 6. OSA / pHTN - on CPAP at night, has home O2 to use prn 7. COPD 8. Chron diast CHF 9. Anemia ckd - get records. Hb here is 8.2.    RKelly Splinter MD 02/05/2021, 4:35 PM  Recent Labs  Lab 02/05/21 1205 02/05/21 1306  WBC 5.4  --   HGB 8.2* 7.5*   Recent Labs  Lab 02/05/21 1205 02/05/21 1306  K 3.8 3.9  BUN 31* 33*  CREATININE 9.38* 10.00*  CALCIUM 9.2  --

## 2021-02-06 LAB — MRSA PCR SCREENING: MRSA by PCR: NEGATIVE

## 2021-02-06 LAB — RENAL FUNCTION PANEL
Albumin: 3.5 g/dL (ref 3.5–5.0)
Anion gap: 7 (ref 5–15)
BUN: 6 mg/dL — ABNORMAL LOW (ref 8–23)
CO2: 29 mmol/L (ref 22–32)
Calcium: 9.9 mg/dL (ref 8.9–10.3)
Chloride: 101 mmol/L (ref 98–111)
Creatinine, Ser: 3.23 mg/dL — ABNORMAL HIGH (ref 0.44–1.00)
GFR, Estimated: 16 mL/min — ABNORMAL LOW (ref >60)
Glucose, Bld: 82 mg/dL (ref 70–99)
Phosphorus: 2.6 mg/dL (ref 2.5–4.6)
Potassium: 3.2 mmol/L — ABNORMAL LOW (ref 3.5–5.1)
Sodium: 137 mmol/L (ref 135–145)

## 2021-02-06 LAB — GLUCOSE, CAPILLARY
Glucose-Capillary: 72 mg/dL (ref 70–99)
Glucose-Capillary: 83 mg/dL (ref 70–99)

## 2021-02-06 LAB — HEMOGLOBIN A1C
Hgb A1c MFr Bld: 5.5 % (ref 4.8–5.6)
Mean Plasma Glucose: 111.15 mg/dL

## 2021-02-06 LAB — TSH: TSH: 2.229 u[IU]/mL (ref 0.350–4.500)

## 2021-02-06 MED ORDER — NIFEDIPINE ER OSMOTIC RELEASE 90 MG PO TB24
90.0000 mg | ORAL_TABLET | Freq: Every day | ORAL | Status: DC
  Administered 2021-02-06 – 2021-02-07 (×2): 90 mg via ORAL
  Filled 2021-02-06 (×2): qty 1

## 2021-02-06 MED ORDER — DILTIAZEM HCL ER COATED BEADS 120 MG PO CP24
120.0000 mg | ORAL_CAPSULE | Freq: Every day | ORAL | Status: DC
  Administered 2021-02-06 – 2021-02-07 (×2): 120 mg via ORAL
  Filled 2021-02-06 (×2): qty 1

## 2021-02-06 MED ORDER — ACETAMINOPHEN 325 MG PO TABS
650.0000 mg | ORAL_TABLET | Freq: Four times a day (QID) | ORAL | Status: DC | PRN
  Administered 2021-02-07: 650 mg via ORAL

## 2021-02-06 MED ORDER — ALLOPURINOL 100 MG PO TABS
100.0000 mg | ORAL_TABLET | Freq: Every day | ORAL | Status: DC
  Administered 2021-02-06 – 2021-02-07 (×2): 100 mg via ORAL
  Filled 2021-02-06 (×2): qty 1

## 2021-02-06 NOTE — TOC Progression Note (Addendum)
Transition of Care [facility]) - Progression Note    Patient Details  Name: Sheona Alim MRN: [id number] Date of Birth: 06/18/1956  Transition of Care [facility]) CM/SW Contact  Bartholomew Crews, RN Phone Number: [phone] 02/06/2021, 4:24 PM  Clinical Narrative:     Spoke with patient at the bedside to discuss pending referral for [facility]. Patient discussed wanting assistance with getting PCS through her Medicaid. [id number] completed, signed by MD, and faxed via expedited fax [phone]. Discussed available home care agencies for [facility]. Patient requesting additional DME - will need DME order for tub transfer bench. [employer] notified of additional DME needs. TOC following for transition needs.   Expected Discharge Plan: [place] Barriers to Discharge: Continued Medical Work up  Expected Discharge Plan and Services Expected Discharge Plan: [place] In-house Referral: Clinical Social Work Discharge Planning Services: CM Consult Post Acute Care Choice: [place] arrangements for the past 2 months: Apartment                 DME Arranged: Gilford Rile DME Agency: [employer]       HH Arranged: PT,OT,Social Work           Social Determinants of Health ([place]) Interventions    Readmission Risk Interventions No flowsheet data found.

## 2021-02-06 NOTE — Evaluation (Signed)
Physical Therapy Evaluation Patient Details Name: Julie [name] MRN: [id number] DOB: 10-Dec-1955 Today's Date: 02/06/2021   History of Present Illness  Pt is a 65 y.o. female admitted wiht SOB and BLE weakness x3 days. Patient found to have volume overload. Patient also reporting chest pain on 3/25 (has since resolved). Patient recently admitted 2/5-2/9 with SOB and acute hypoxic respiratory failure 2/2 COVID-19 PNA. PMH includes CHF, asthma, HTN, DM2, Afib, ESRD (HD MWF), gout.  Clinical Impression   Pt admitted with above diagnosis. Comes from home wehre she lives alone in a single level home with a few steps to enter; Independent until recent admission for Covid, and required supplemental O2 after that recent hospitalization;  Lamonte Sakai been walking with a straight cane as her rollator RW is broken;  Presents to PT with generalized weakness, dyspnea on exertion;  3/4 DOE with hallway ambulation;  Did not require supplemental O2 to keep O2 sats at or above 88%; Discussed with Dr. Wynetta Emery;  Pt currently with functional limitations due to the deficits listed below (see PT Problem List). Pt will benefit from skilled PT to increase their independence and safety with mobility to allow discharge to the venue listed below.       Follow Up Recommendations Home health PT;Supervision - Intermittent    Equipment Recommendations  Other (comment) (Rollator RW (reports hers is malfunctioning))    Recommendations for Other Services OT consult (as ordered)     Precautions / Restrictions Precautions Precautions: Fall      Mobility  Bed Mobility               General bed mobility comments: Patient seated EOB upon entry.    Transfers Overall transfer level: Needs assistance Equipment used: Rolling walker (2 wheeled) Transfers: Sit to/from Stand Sit to Stand: Min guard         General transfer comment: Minguard A with increased time to come to standing from EOB positioned in lowest  setting.  Ambulation/Gait Ambulation/Gait assistance: Min guard (without physical contact) Gait Distance (Feet): 120 Feet Assistive device: Rolling walker (2 wheeled) Gait Pattern/deviations: Step-through pattern     General Gait Details: Noting dependence on UE support for balance; Walked on room air and O2 sats decr to 90% lowest observed; Did not need to turn on supplemental O2 to keep O2 sats greater tahn 87%; Dyspnea 3/4  Stairs            Wheelchair Mobility    Modified Rankin (Stroke Patients Only)       Balance     Sitting balance-Leahy Scale: Normal       Standing balance-Leahy Scale: Poor Standing balance comment: Relinat on at least unilateral UE support.                             Pertinent Vitals/Pain Pain Assessment: No/denies pain    Home Living Family/patient expects to be discharged to:: Private residence Living Arrangements: Alone Available Help at Discharge: Family;Available PRN/intermittently Type of Home: Apartment Home Access: Stairs to enter Entrance Stairs-Rails: None Entrance Stairs-Number of Steps: 3 Home Layout: One level Home Equipment: Walker - 4 wheels;Cane - single point (Patient reports rollator break is broken.) Additional Comments: Daughter lives nearby, but busy with family of her own.  Pt reports she has a shower seat that "isn't working right", and plans to acquire a new one    Prior Function Level of Independence: Independent with assistive device(s)  Comments: Mod indep household ambulator with use of SPC. Uses transportation to HD/appts; daughter assists with grocery shopping.     Hand Dominance   Dominant Hand: Right    Extremity/Trunk Assessment   Upper Extremity Assessment Upper Extremity Assessment: Defer to OT evaluation    Lower Extremity Assessment Lower Extremity Assessment: Generalized weakness    Cervical / Trunk Assessment Cervical / Trunk Assessment: Normal   Communication   Communication: No difficulties  Cognition Arousal/Alertness: Awake/alert Behavior During Therapy: WFL for tasks assessed/performed Overall Cognitive Status: Within Functional Limits for tasks assessed                                        General Comments General comments (skin integrity, edema, etc.): Walked on room air and O2 sats decr to 90% lowest observed; Did not need to turn on supplemental O2 to keep O2 sats greater tahn 87%; Dyspnea 3/4    Exercises     Assessment/Plan    PT Assessment Patient needs continued PT services  PT Problem List Decreased strength;Decreased activity tolerance;Decreased balance;Decreased mobility;Decreased knowledge of use of DME       PT Treatment Interventions DME instruction;Gait training;Functional mobility training;Stair training;Therapeutic activities;Therapeutic exercise;Balance training;Patient/family education    PT Goals (Current goals can be found in the Care Plan section)  Acute Rehab PT Goals Patient Stated Goal: To return home. PT Goal Formulation: With patient Time For Goal Achievement: 02/20/21 Potential to Achieve Goals: Good    Frequency Min 3X/week   Barriers to discharge        Co-evaluation               AM-PAC PT "6 Clicks" Mobility  Outcome Measure Help needed turning from your back to your side while in a flat bed without using bedrails?: None Help needed moving from lying on your back to sitting on the side of a flat bed without using bedrails?: None Help needed moving to and from a bed to a chair (including a wheelchair)?: None Help needed standing up from a chair using your arms (e.g., wheelchair or bedside chair)?: None Help needed to walk in hospital room?: None Help needed climbing 3-5 steps with a railing? : A Little 6 Click Score: 23    End of Session Equipment Utilized During Treatment: Gait belt Activity Tolerance: Patient tolerated treatment well;Other  (comment) (though anxious at the notion of not needing supplemantl O2) Patient left: in bed;with call bell/phone within reach Nurse Communication: Mobility status PT Visit Diagnosis: Other abnormalities of gait and mobility (R26.89)    Time: 1113-1200 (minus a considerable amount of time for Internal medicine, Nephrology, and Admissions to see her while I waited) PT Time Calculation (min) (ACUTE ONLY): 47 min   Charges:   PT Evaluation $PT Eval Moderate Complexity: 1 Mod PT Treatments $Gait Training: 8-22 mins        Roney Marion, PT  [place] Pager [phone] Office [place] 02/06/2021, 4:56 PM

## 2021-02-06 NOTE — TOC Initial Note (Signed)
Transition of Care [facility]) - Initial/Assessment Note    Patient Details  Name: Julie [name] MRN: [id number] Date of Birth: 1956-11-08  [facility] CM/SW Contact:    Bartholomew Crews, RN Phone Number: [phone] 02/06/2021, 2:45 PM  Clinical Narrative:                  Spoke with patient at the bedside. Demographics and PCP verified in Epic. PTA home alone. Attends [facility] on MWF, and uses Medicaid transportation for dialysis transportation.   Patient stated that she is considering a move to [employer]. Advised to follow transfer policies for outpatient hemodialysis and DSS for transfer of Medicaid.   Discussed reccommendations for [facility] PT, OT and 4-wheel walker with seat (rollator). Patient agreeable. Discussed agency choice - no preference - referrals pending. Patient will need HH PT/OT/SW with Face to Face and DME order for 4-wheel walker with seat.    Patient stated that her daughter will likely provide transportation home at time of discharge.   TOC following for transition needs.   Expected Discharge Plan: [place] Barriers to Discharge: Continued Medical Work up   Patient Goals and CMS Choice Patient states their goals for this hospitalization and ongoing recovery are:: return home CMS Medicare.gov Compare Post Acute Care list provided to:: Patient Choice offered to / list presented to : Patient  Expected Discharge Plan and Services Expected Discharge Plan: [place] In-house Referral: Clinical Social Work Discharge Planning Services: CM Consult Post Acute Care Choice: [place] arrangements for the past 2 months: Apartment                 DME Arranged: Gilford Rile DME Agency: [employer]       HH Arranged: PT,OT,Social Work          Prior Living Arrangements/Services Living arrangements for the past 2 months: Apartment Lives with:: Self Patient language and need for interpreter reviewed:: Yes Do  you feel safe going back to the place where you live?: Yes      Need for Family Participation in Patient Care: Yes (Comment) Care giver support system in place?: Yes (comment) Current home services: DME Criminal Activity/Legal Involvement Pertinent to Current Situation/Hospitalization: No - Comment as needed  Activities of Daily Living      Permission Sought/Granted                  Emotional Assessment Appearance:: Appears stated age Attitude/Demeanor/Rapport: Engaged Affect (typically observed): Accepting Orientation: : Oriented to Self,Oriented to  Time,Oriented to Place,Oriented to Situation Alcohol / Substance Use: Not Applicable Psych Involvement: No (comment)  Admission diagnosis:  Shortness of breath [R06.02] Acute pulmonary edema (HCC) [J81.0] Leg weakness, bilateral [R29.898] Patient Active Problem List   Diagnosis Date Noted  . Leg weakness, bilateral 02/05/2021  . Pneumonia due to COVID-19 virus 12/17/2020  . Stiffness of joint, not elsewhere classified, pelvic region and thigh 06/01/2014  . Lumbago 06/01/2014  . Muscle weakness (generalized) 06/01/2014  . Abnormality of gait 06/01/2014  . Spinal stenosis of lumbar region 07/16/2013  . LBBB (left bundle branch block) 01/15/2013  . HTN (hypertension) 01/15/2013  . Diastolic CHF, chronic ([place]) 01/15/2013   PCP:  Antony Salmon, MD Pharmacy:   [address], [place] [address] Phone: [phone] Fax: [phone]     Social Determinants of Health (SDOH) Interventions    Readmission Risk  Interventions No flowsheet data found.

## 2021-02-06 NOTE — Evaluation (Signed)
Occupational Therapy Evaluation Patient Details Name: Julie [name] MRN: [id number] DOB: 30-Jun-1956 Today's Date: 02/06/2021    History of Present Illness Pt is a 65 y.o. female admitted wiht SOB and BLE weakness x3 days. Patient found to have volume overload. Patient also reporting chest pain on 3/25 (has since resolved). Patient recently admitted 2/5-2/9 with SOB and acute hypoxic respiratory failure 2/2 COVID-19 PNA. PMH includes CHF, asthma, HTN, DM2, Afib, ESRD (HD MWF), gout.   Clinical Impression   PTA patient was living alone in a ground level apartment with 3STE and was Mod I grossly with ADLs. Patient gets transportation to HD MWF and daughter assists with grocery shopping. Patient reports use of 2-3L of supplemental O2 at home. Patient currently functioning near baseline level of function limited by decreased activity tolerance, generalized weakness, and need for frequent rest breaks. Patient would benefit from continued acute OT services in prep for safe d/c home with recommendation for HHOT and intermittent supervision/assist.     Follow Up Recommendations  Home health OT;Supervision - Intermittent    Equipment Recommendations  Other (comment) (TBD)    Recommendations for Other Services       Precautions / Restrictions Precautions Precautions: Fall Restrictions Weight Bearing Restrictions: No      Mobility Bed Mobility               General bed mobility comments: Patient seated EOB upon entry.    Transfers Overall transfer level: Needs assistance Equipment used: Straight cane Transfers: Sit to/from Stand Sit to Stand: Supervision         General transfer comment: Supervision A with increased time to come to standing from EOB positioned in lowest setting.    Balance Overall balance assessment: Needs assistance Sitting-balance support: No upper extremity supported;Feet supported Sitting balance-Leahy Scale: Normal     Standing balance support: Single  extremity supported;During functional activity Standing balance-Leahy Scale: Poor Standing balance comment: Relinat on at least unilateral UE support.                           ADL either performed or assessed with clinical judgement   ADL Overall ADL's : Needs assistance/impaired Eating/Feeding: Independent   Grooming: Supervision/safety;Standing Grooming Details (indicate cue type and reason): Oral hygiene standing at sink level. Mild SOB noted.             Lower Body Dressing: Supervision/safety;Sit to/from stand Lower Body Dressing Details (indicate cue type and reason): Able to doff/don footwear seated EOB with supervision A. Toilet Transfer: Copy Details (indicate cue type and reason): Simulated with transfer to EOB with supervision A and use of SPC.         Functional mobility during ADLs: Supervision/safety;Cane General ADL Comments: Patient limited by decreased activity tolernace, need for frequent rest breaks and generalized weakness.     Vision Baseline Vision/History: Wears glasses Patient Visual Report: No change from baseline       Perception     Praxis      Pertinent Vitals/Pain Pain Assessment: No/denies pain     Hand Dominance Right   Extremity/Trunk Assessment Upper Extremity Assessment Upper Extremity Assessment: Overall WFL for tasks assessed   Lower Extremity Assessment Lower Extremity Assessment: Defer to PT evaluation   Cervical / Trunk Assessment Cervical / Trunk Assessment: Normal   Communication Communication Communication: No difficulties   Cognition Arousal/Alertness: Awake/alert Behavior During Therapy: WFL for tasks assessed/performed Overall Cognitive Status: Within Functional Limits for  tasks assessed                                     General Comments  VSS on 3L O2 via .    Exercises     Shoulder Instructions      Home Living Family/patient expects to be  discharged to:: Private residence Living Arrangements: Alone Available Help at Discharge: Family;Available PRN/intermittently Type of Home: Apartment Home Access: Stairs to enter Entrance Stairs-Number of Steps: 3 Entrance Stairs-Rails: None Home Layout: One level     Bathroom Shower/Tub: Occupational psychologist: Standard Bathroom Accessibility: Yes   Home Equipment: Environmental consultant - 4 wheels;Cane - single point (Patient reports rollator break is broken.)   Additional Comments: Daughter lives nearby, but busy with family of her own.  Pt reports she has a shower seat that "isn't working right", and plans to acquire a new one      Prior Functioning/Environment Level of Independence: Independent with assistive device(s)        Comments: Mod indep household ambulator with use of SPC. Uses transportation to HD/appts; daughter assists with grocery shopping.        OT Problem List: Decreased strength;Decreased activity tolerance;Impaired balance (sitting and/or standing)      OT Treatment/Interventions: Self-care/ADL training;Therapeutic exercise;Energy conservation;DME and/or AE instruction;Therapeutic activities;Patient/family education;Balance training    OT Goals(Current goals can be found in the care plan section) Acute Rehab OT Goals Patient Stated Goal: To return home. OT Goal Formulation: With patient Time For Goal Achievement: 02/20/21 Potential to Achieve Goals: Good ADL Goals Additional ADL Goal #1: Patient will complete a.m. ADLs with Mod I, LRAD and good return demo of energy conservation techniques. Additional ADL Goal #2: Patient will tolerate 2 sets x 10 reps each of BUE HEP to improve strength in prep for ADLs.  OT Frequency: Min 2X/week   Barriers to D/C: Decreased caregiver support  Lives alone       Co-evaluation              AM-PAC OT "6 Clicks" Daily Activity     Outcome Measure Help from another person eating meals?: None Help from another  person taking care of personal grooming?: A Little Help from another person toileting, which includes using toliet, bedpan, or urinal?: A Little Help from another person bathing (including washing, rinsing, drying)?: A Little Help from another person to put on and taking off regular upper body clothing?: A Little Help from another person to put on and taking off regular lower body clothing?: A Little 6 Click Score: 19   End of Session    Activity Tolerance: Patient tolerated treatment well Patient left: in bed;with call bell/phone within reach;with bed alarm set;Other (comment) (Patient seated EOB.)  OT Visit Diagnosis: Unsteadiness on feet (R26.81);Muscle weakness (generalized) (M62.81)                Time: [id number] OT Time Calculation (min): 13 min Charges:  OT General Charges $OT Visit: 1 Visit OT Evaluation $OT Eval Low Complexity: 1 Low  Lanyla Costello H. OTR/L Supplemental OT, Department of rehab services [phone]  Ova Gillentine R H. 02/06/2021, 8:58 AM

## 2021-02-06 NOTE — Progress Notes (Signed)
Pt states she will wear CPAP tomorrow night since she was late getting back from HD tonight, RT will continue to monitor

## 2021-02-06 NOTE — Discharge Summary (Signed)
Name: Julie [name] MRN: [id number] DOB: [birth year] 65 y.o. PCP: Julie Salmon, MD  Date of Admission: 02/05/2021 11:58 AM Date of Discharge: 02/07/2021 Attending Physician: Angelica Pou, MD  Discharge Diagnosis: 1. Active Problems:   Leg weakness, bilateral  Discharge Medications: Allergies as of 02/07/2021      Reactions   Iron Other (See Comments)   Tongue swelling   Atorvastatin Other (See Comments)   Muscle cramps   Carvedilol Other (See Comments)   alopecia   Metoprolol Other (See Comments)   Hair thinning   Rosuvastatin Other (See Comments)   Muscle cramps      Medication List    STOP taking these medications   furosemide 80 MG tablet Commonly known as: LASIX   glipiZIDE 5 MG 24 hr tablet Commonly known as: GLUCOTROL XL   hydrALAZINE 25 MG tablet Commonly known as: APRESOLINE   labetalol 100 MG tablet Commonly known as: NORMODYNE     TAKE these medications   acetaminophen 500 MG tablet Commonly known as: TYLENOL Take 500-1,000 mg by mouth daily as needed for pain.   albuterol 108 (90 Base) MCG/ACT inhaler Commonly known as: VENTOLIN HFA Inhale 2 puffs into the lungs every 4 (four) hours as needed for wheezing.   allopurinol 100 MG tablet Commonly known as: ZYLOPRIM Take 100 mg by mouth daily.   aspirin EC 81 MG tablet Take 81 mg by mouth daily.   Calcium Carbonate 500 (200 Ca) MG Wafr Chew 2 tablets by mouth 3 (three) times daily with meals.   diltiazem 120 MG 24 hr capsule Commonly known as: CARDIZEM CD Take 120 mg by mouth daily.   Eliquis 5 MG Tabs tablet Generic drug: apixaban Take 5 mg by mouth 2 (two) times daily.   fluticasone 50 MCG/ACT nasal spray Commonly known as: FLONASE Place 2 sprays into the nose daily as needed for allergies.   gabapentin 100 MG capsule Commonly known as: NEURONTIN Take 100 mg by mouth daily. May take an additional 1 capsule four hours after dialysis.   hydrOXYzine 10 MG tablet Commonly known  as: ATARAX/VISTARIL Take 5-10 mg by mouth daily as needed for itching.   Melatonin 5 MG Caps Take 5 mg by mouth at bedtime as needed (sleep).   NIFEdipine 90 MG 24 hr tablet Commonly known as: PROCARDIA XL/NIFEDICAL-XL Take 90 mg by mouth daily.   nitroGLYCERIN 0.4 MG SL tablet Commonly known as: NITROSTAT Place 0.4 mg under the tongue every 5 (five) minutes as needed for chest pain (1 tablet under tongue every 5 minutes as needed for chest pain).   omeprazole 20 MG capsule Commonly known as: PRILOSEC Take 20 mg by mouth 2 (two) times daily as needed (acid reflux).   pravastatin 10 MG tablet Commonly known as: PRAVACHOL Take 5 mg by mouth daily.   Restasis 0.05 % ophthalmic emulsion Generic drug: cycloSPORINE Place 1 drop into both eyes in the morning and at bedtime.            Durable Medical Equipment  (From admission, onward)         Start     Ordered   02/06/21 1636  For home use only DME Tub bench  Once       Comments: Tub transfer bench   02/06/21 1636   02/06/21 1511  For home use only DME 4 wheeled rolling walker with seat  Once       Question Answer Comment  Patient needs a walker to treat with  the following condition Spinal stenosis   Patient needs a walker to treat with the following condition Congestive heart failure [facility])      02/06/21 1510          Disposition and follow-up:   Ms.Julie [name] was discharged from [name] in Stable condition.  At the hospital follow up visit please address:  1.   ESRD: Patient presented hypervolemic in the setting of missing one dialysis session the week prior to admission. Patient received aggressive dialysis with new estimated dry weight of 114.5kg. Furosemide discontinued. Patient to continue with MWF dialysis sessions at [facility]. Please assess volume status and adherence to dialysis.  Support at home: Patient reported inability to perform ADLs on admission due to weakness. Patient  discharged home with Home Health PT, DME and PCS. Please ensure patient's home needs are adequate.  Hypertension: Patient has experienced adverse reactions to several antihypertensive medications including metoprolol, carvedilol, losartan, hydralazine and labetalol. Assess patient's tolerance of nifedipine and consider working with patient to add additional antihypertensive to which she would be adherent.  Diabetes: Patient's HbA1c at 5.5. Encouraged to discontinue glipizide.  2.  Labs / imaging needed at time of follow-up: None  3.  Pending labs/ test needing follow-up: None  Follow-up Appointments:  Follow-up Information    Julie Salmon, MD Follow up.   Specialty: Family Medicine Why: 1-2 weeks Contact information: [place] [address] [phone]        [facility] Follow up.   Why: call for questions about aide services - nurse will call in 2 weeks to do a more comprehensive assessment  Contact information: [phone]       [facility] Follow up.   Contact information: [place] by problem list: 1. ESRD: Patient presented hypervolemic with dyspnea in the setting of missing one dialysis session the week prior to admission. Her weight was approximately 4kg down from prior admission, suggesting that her estimated dry weight was inaccurate. Patient underwent extra dialysis session on Sunday with resuming her regular dialysis schedule on Monday evening. Patient's estimated dry weight on day of discharge was 114.5kg. Patient euvolemic on examination with no complaints of dyspnea. Plan for patient to discharge with regularly scheduled HD sessions on MWF.  Bilateral lower extremity weakness: Patient reported inability to perform ADLs on admission due to weakness in her lower extremities. Patient was evaluated by PT and OT with recommendation to obtain home health services. Patient discharged  home with Home Health PT, DME and PCS.  Hypertension: Patient has experienced adverse reactions to several antihypertensive medications including metoprolol, carvedilol, losartan, hydralazine and labetalol. Blood pressure on admission elevated to [id number] systolic in the setting of nonadherence to antihypertensive medications and missing dialysis session. Patient's home antihypertensive regimen adjusted to reflect the medications that she can tolerate. Plan to have patient work with PCP to adjust antihypertensive medication regimen but continue nifedipine.  Diabetes: Patient's HbA1c at 5.5 on admission. Patient started on sliding scale, however she did not require insulin during hospitalization. Encouraged to discontinue glipizide.  Paroxysmal atrial fibrillation: Patient remained in normal sinus rhythm with appropriate rate control throughout hospitalization. Patient continued on home diltiazem and apixaban.  Obstructive sleep apnea: Patient declined to wear CPAP during her two nights of hospitalization.  Discharge Exam:   BP 132/78 (BP Location: Right Arm)   Pulse 100   Temp 98.5 F (36.9 C) (Oral)  Resp 18   Ht '5\' 6"'$  (1.676 m)   Wt 115.6 kg   SpO2 100%   BMI 41.13 kg/m   Pertinent Labs, Studies, and Procedures:  CBC Latest Ref Rng & Units 02/05/2021 02/05/2021 12/21/2020  WBC 4.0 - 10.5 K/uL - 5.4 6.8  Hemoglobin 12.0 - 15.0 g/dL 7.5(L) 8.2(L) 7.9(L)  Hematocrit 36.0 - 46.0 % 22.0(L) 25.6(L) 23.0(L)  Platelets 150 - 400 K/uL - 231 290   CMP Latest Ref Rng & Units 02/06/2021 02/05/2021 02/05/2021  Glucose 70 - 99 mg/dL 82 111(H) 142(H)  BUN 8 - 23 mg/dL 6(L) 33(H) 31(H)  Creatinine 0.44 - 1.00 mg/dL 3.23(H) 10.00(H) 9.38(H)  Sodium 135 - 145 mmol/L 137 141 139  Potassium 3.5 - 5.1 mmol/L 3.2(L) 3.9 3.8  Chloride 98 - 111 mmol/L 101 103 104  CO2 22 - 32 mmol/L 29 - 24  Calcium 8.9 - 10.3 mg/dL 9.9 - 9.2  Total Protein 6.5 - 8.1 g/dL - - -  Total Bilirubin 0.3 - 1.2 mg/dL - - -   Alkaline Phos 38 - 126 U/L - - -  AST 15 - 41 U/L - - -  ALT 0 - 44 U/L - - -   TSH - 2.229 HbA1c - 5.5 BNP 457.4 Troponin 20 then 18  DG Chest 2 View  Result Date: 02/05/2021 CLINICAL DATA:  Chest pain and shortness of breath. Bilateral leg weakness EXAM: CHEST - 2 VIEW COMPARISON:  12/17/2020 FINDINGS: Of the x-ray cardiac enlargement. Small bilateral pleural effusions. Pulmonary vascular congestion without overt edema. No airspace consolidation. Visualized osseous structures are unremarkable. IMPRESSION: 1. Cardiac enlargement, pulmonary vascular congestion and small pleural effusions. Correlate for any clinical signs or symptoms of early CHF. Electronically Signed   By: Kerby Moors M.D.   On: 02/05/2021 13:05   CT ANGIO CHEST PE W OR WO CONTRAST  Result Date: 02/05/2021 CLINICAL DATA:  65 year old female with shortness of breath and chest pain for 3 days. EXAM: CT ANGIOGRAPHY CHEST WITH CONTRAST TECHNIQUE: Multidetector CT imaging of the chest was performed using the standard protocol during bolus administration of intravenous contrast. Multiplanar CT image reconstructions and MIPs were obtained to evaluate the vascular anatomy. CONTRAST:  71m OMNIPAQUE IOHEXOL 350 MG/ML SOLN COMPARISON:  12/17/2020 chest CT, 02/05/2021 chest radiograph and prior studies FINDINGS: Cardiovascular: This is a technically borderline study with suboptimal contrast opacification and respiratory motion artifact in the LOWER lungs. No definite pulmonary emboli are identified. Cardiomegaly and very small pericardial effusion again noted. There is no evidence of thoracic aortic aneurysm. Mediastinum/Nodes: No enlarged mediastinal, hilar, or axillary lymph nodes. Thyroid gland, trachea, and esophagus demonstrate no significant findings. Lungs/Pleura: A small RIGHT pleural effusion and mild RIGHT basilar atelectasis noted. No airspace disease, consolidation, mass or pneumothorax noted. Mild interlobular septal  thickening is unchanged. Upper Abdomen: A small hiatal hernia is noted. No acute abnormality noted. Musculoskeletal: No acute or suspicious bony abnormalities are identified. Review of the MIP images confirms the above findings. IMPRESSION: 1. Technically borderline study but no definite evidence of pulmonary emboli. 2. Small RIGHT pleural effusion and mild RIGHT basilar atelectasis. 3. Cardiomegaly and very small pericardial effusion, unchanged. 4. Small hiatal hernia. Electronically Signed   By: JMargarette CanadaM.D.   On: 02/05/2021 14:33   Discharge Instructions: Discharge Instructions    Diet - low sodium heart healthy   Complete by: As directed    Increase activity slowly   Complete by: As directed      Signed:  Cato Mulligan, MD 02/07/2021, 12:08 PM   Pager: [phone]

## 2021-02-06 NOTE — Progress Notes (Signed)
Md on call paged for Tylenol for leg pain Arthor Captain LPN

## 2021-02-06 NOTE — Progress Notes (Addendum)
Subjective: Sitting up in bed about to walk with PT, tolerated HD last night UF 2.8 liter , NO SOB, for dialysis today keep on schedule.  Patient think she is losing weight needs lower dry weight  Objective Vital signs in last 24 hours: Vitals:   02/06/21 0151 02/06/21 0249 02/06/21 0534 02/06/21 0935  BP: (!) 144/87 (!) 151/78 (!) 165/78 (!) 168/92  Pulse: 85 83 82 77  Resp: (!) '21 18 18 16  '$ Temp: 98.2 F (36.8 C) 98.8 F (37.1 C) 98.2 F (36.8 C) 97.9 F (36.6 C)  TempSrc: Oral Oral Oral Oral  SpO2: 100% 98% 96% 98%  Weight:  115.6 kg    Height:  '5\' 6"'$  (1.676 m)     Weight change:   Physical Exam: General: Alert obese female NAD Heart: RRR, no MRG Lungs: Decreased breath sounds bases otherwise CTA nonlabored breathing Abdomen: Obese, bowel sounds, +, ND NT, soft nontender nondistended, no ascites Extremities: Bipedal edema trace  dialysis Access: Positive bruit LUA AVF   Home meds:  - asa 81/ pravastatin 5 qd/ sl ntg prn/ eliquis 5 bid  - cardizem 120 qd/  lasix '80mg'$  nonhd days/ hydralazine 25 tid/ losartan 25 bid/ procardia cl 90 qd  - allopurinol 100 qd/ prilosec 20 bid  - eliquis 5 bid  - neurontin 100 qd  - prn's/ vitamins/ supplements   OP HD: [facility] in [place]  MWF 4hr EDW 117.5 2.0 k, 2.5 CA heparin 6,500 bolus, 400?hr  No ESA  Venofer 50 mg q wk Hec 3 mcg  q hd  Parsabid 2.'5mg'$   q hd    Problem/Plan: 1. Dyspnea - CXR/ CT chest and exam suggest vol overload / pulm edema.  losing body wt. needing lower EDW, no signs or symptoms of PNA.  2.8 L UF HD last evening  and UF again today  get volume down.needs wt= standing.  yest post wt 115.6 ~~1.4 kg below OP dw  2. ESRD - on HD x 37yr on MWF schedule in [place] Hd today on schedule  3. HTN - cont home meds, currently not able to taper any BP meds, UF on the HD, questionable if she needs Lasix 4. Atrial fib - per primary team 5. DM2 -management per admit ,on po medication 6. OSA / pHTN - on CPAP  at night, has home O2 to use prn 7. COPD 8. Chron diast CHF 9. Anemia ckd -Hgb 7.5  No esa as op , Venofer q wk , start Aranesp 40 q mon hd  10. MBD=.  Calcium 9.9 Phos 2.6, holding Os-Cal for now with lowish phosphorus , Hec 368m  Start next hd / 11. HO Covid  Feb 2022  DaErnest HaberPA-C [facility]per [id number]/28/2022,11:42 AM  LOS: 1 day   Labs: Basic Metabolic Panel: Recent Labs  Lab 02/05/21 1205 02/05/21 1306 02/06/21 0130  NA 139 141 137  K 3.8 3.9 3.2*  CL 104 103 101  CO2 24  --  29  GLUCOSE 142* 111* 82  BUN 31* 33* 6*  CREATININE 9.38* 10.00* 3.23*  CALCIUM 9.2  --  9.9  PHOS  --   --  2.6   Liver Function Tests: Recent Labs  Lab 02/06/21 0130  ALBUMIN 3.5   No results for input(s): LIPASE, AMYLASE in the last 168 hours. No results for input(s): AMMONIA in the last 168 hours. CBC: Recent Labs  Lab 02/05/21 1205 02/05/21 1306  WBC 5.4  --  HGB 8.2* 7.5*  HCT 25.6* 22.0*  MCV 92.1  --   PLT 231  --    Cardiac Enzymes: No results for input(s): CKTOTAL, CKMB, CKMBINDEX, TROPONINI in the last 168 hours. CBG: Recent Labs  Lab 02/05/21 1822 02/05/21 2036 02/06/21 0706  GLUCAP 85 166* 72    Studies/Results: DG Chest 2 View  Result Date: 02/05/2021 CLINICAL DATA:  Chest pain and shortness of breath. Bilateral leg weakness EXAM: CHEST - 2 VIEW COMPARISON:  12/17/2020 FINDINGS: Of the x-ray cardiac enlargement. Small bilateral pleural effusions. Pulmonary vascular congestion without overt edema. No airspace consolidation. Visualized osseous structures are unremarkable. IMPRESSION: 1. Cardiac enlargement, pulmonary vascular congestion and small pleural effusions. Correlate for any clinical signs or symptoms of early CHF. Electronically Signed   By: Kerby Moors M.D.   On: 02/05/2021 13:05   CT ANGIO CHEST PE W OR WO CONTRAST  Result Date: 02/05/2021 CLINICAL DATA:  65 year old female with shortness of breath and chest pain  for 3 days. EXAM: CT ANGIOGRAPHY CHEST WITH CONTRAST TECHNIQUE: Multidetector CT imaging of the chest was performed using the standard protocol during bolus administration of intravenous contrast. Multiplanar CT image reconstructions and MIPs were obtained to evaluate the vascular anatomy. CONTRAST:  50m OMNIPAQUE IOHEXOL 350 MG/ML SOLN COMPARISON:  12/17/2020 chest CT, 02/05/2021 chest radiograph and prior studies FINDINGS: Cardiovascular: This is a technically borderline study with suboptimal contrast opacification and respiratory motion artifact in the LOWER lungs. No definite pulmonary emboli are identified. Cardiomegaly and very small pericardial effusion again noted. There is no evidence of thoracic aortic aneurysm. Mediastinum/Nodes: No enlarged mediastinal, hilar, or axillary lymph nodes. Thyroid gland, trachea, and esophagus demonstrate no significant findings. Lungs/Pleura: A small RIGHT pleural effusion and mild RIGHT basilar atelectasis noted. No airspace disease, consolidation, mass or pneumothorax noted. Mild interlobular septal thickening is unchanged. Upper Abdomen: A small hiatal hernia is noted. No acute abnormality noted. Musculoskeletal: No acute or suspicious bony abnormalities are identified. Review of the MIP images confirms the above findings. IMPRESSION: 1. Technically borderline study but no definite evidence of pulmonary emboli. 2. Small RIGHT pleural effusion and mild RIGHT basilar atelectasis. 3. Cardiomegaly and very small pericardial effusion, unchanged. 4. Small hiatal hernia. Electronically Signed   By: JMargarette CanadaM.D.   On: 02/05/2021 14:33   Medications:  . apixaban  5 mg Oral BID  . aspirin EC  81 mg Oral Daily  . Chlorhexidine Gluconate Cloth  6 each Topical Q0600  . cycloSPORINE  1 drop Both Eyes BID  . insulin aspart  0-5 Units Subcutaneous QHS  . insulin aspart  0-6 Units Subcutaneous TID WC  . pantoprazole  40 mg Oral Daily  . pravastatin  5 mg Oral Daily

## 2021-02-06 NOTE — Progress Notes (Signed)
Subjective:   Overnight, patient underwent hemodialysis with removal of 2.8L. She declined recommendation to wear CPAP.  This morning, patient reports that she feels much improved with her evening dialysis session. She reports that it is easier for her to talk and that her breathing problem is improved. She continues to endorse lower extremity weakness which has been ongoing since prior to her hospitalization for COVID-19. She looks forward to working with physical therapy today and her regularly scheduled dialysis session tonight.  Objective:  Vital signs in last 24 hours: Vitals:   02/06/21 0151 02/06/21 0249 02/06/21 0534 02/06/21 0935  BP: (!) 144/87 (!) 151/78 (!) 165/78 (!) 168/92  Pulse: 85 83 82 77  Resp: (!) '21 18 18 16  '$ Temp: 98.2 F (36.8 C) 98.8 F (37.1 C) 98.2 F (36.8 C) 97.9 F (36.6 C)  TempSrc: Oral Oral Oral Oral  SpO2: 100% 98% 96% 98%  Weight:  115.6 kg    Height:  '5\' 6"'$  (1.676 m)    On room air  Intake/Output Summary (Last 24 hours) at 02/06/2021 1318 Last data filed at 02/06/2021 0600 Gross per 24 hour  Intake 240 ml  Output 3043 ml  Net -2803 ml   Filed Weights   02/05/21 1800 02/06/21 0249  Weight: 116.6 kg 115.6 kg   Physical Exam Vitals and nursing note reviewed.  Constitutional:      General: She is not in acute distress.    Appearance: She is not ill-appearing.     Comments: Comfortably sitting on edge of bed with cousin by her side, no acute distress  Neck:     Comments: Unable to assess JVD Cardiovascular:     Rate and Rhythm: Normal rate and regular rhythm.     Heart sounds: Normal heart sounds.  Pulmonary:     Effort: Pulmonary effort is normal. No respiratory distress.     Breath sounds: Decreased breath sounds present.  Musculoskeletal:     Right lower leg: No edema.     Left lower leg: No edema.  Neurological:     General: No focal deficit present.     Mental Status: She is alert.  Psychiatric:        Mood and Affect: Mood  normal.        Behavior: Behavior normal.   Labs since admission: BMP Latest Ref Rng & Units 02/06/2021 02/05/2021 02/05/2021  Glucose 70 - 99 mg/dL 82 111(H) 142(H)  BUN 8 - 23 mg/dL 6(L) 33(H) 31(H)  Creatinine 0.44 - 1.00 mg/dL 3.23(H) 10.00(H) 9.38(H)  Sodium 135 - 145 mmol/L 137 141 139  Potassium 3.5 - 5.1 mmol/L 3.2(L) 3.9 3.8  Chloride 98 - 111 mmol/L 101 103 104  CO2 22 - 32 mmol/L 29 - 24  Calcium 8.9 - 10.3 mg/dL 9.9 - 9.2  Hemoglobin A1c - 5.5 Glucose - 72, 166, 85, 128  Imaging since admission: None  Assessment/Plan:  Active Problems:   Leg weakness, bilateral  Julie [name] is a 65 year old woman with past medical history significant for ESRD (on HD MWF), diastolic congestive heart failure, OSA (on CPAP), asthma, atrial fibrillation (on Eliquis), spinal stenosis of lumbar region, T2DM, gout, HTN, and recent hospitalization for acute hypoxic respiratory failure 2/2 COVID-19 infection (12/17/20 to 12/21/20) who presented to [facility] on 02/05/21 for evaluation of bilateral lower extremity weakness found to have volume overload.  #ESRD (on HD MWF), chronic Patient underwent hemodialysis last night with removal of 2.8L. Patient tolerated well and now appears euvolemic on  examination. Patient appears to be losing weight as her weight on admission is down 3.4kg since last admission and she appeared volume overloaded on arrival. Nephrology has been consulted, greatly appreciate recommendations. Patient is planned for repeat dialysis session this evening. -Nephrology managing  -Dialysis tonight  -Strict intake and output  -Daily weights  #Bilateral lower extremity weakness, chronic #Spinal stenosis of lumbar region, chronic Patient continues to endorse ongoing weakness which has been present for several months, however this is improved following hemodialysis yesterday. Patient would benefit from skilled physical and occupational therapy assessment with recommendations for equipment  and follow-up recommendations -PT and OT consulted, appreciate recommendations  #Hypertension, chronic Blood pressure marginally improved following HD, however another session scheduled for today. Patient has poor adherence to antihypertensive medications and several reported allergies to various medications including metoprolol, carvedilol, hydralazine, and losartan. Patient not interested in starting labetalol which was recently added to her medication list. -Restart home nifedipine '90mg'$  daily -Continue to hold home labetalol, hydralazine and losartan  #Paroxysmal atrial fibrillation, chronic Patient remains in normal sinus rhythm with appropriate rate control throughout hospitalization. -Restart home diltiazem '120mg'$  daily -Continue home apixaban '5mg'$  twice daily  #Type 2 diabetes mellitus, chronic Patient's HbA1c is 5.5 on admission with prescribed glipizide, although unclear adherence. -Discontinue home glipizide -Very sensitive SSI -CBG monitoring 4 times daily  #Obstructive sleep apnea, chronic Patient has CPAP at home for her OSA. Uncontrolled sleep apnea likely contributing to her poorly controlled hypertension. -CPAP nightly  #Diastolic congestive heart failure, chronic Patient appears euvolemic on examination today with no edema of the lower legs and lungs clear to auscultation bilaterally following removal of 2.8L yesterday. Plan per nephrology is repeat HD tonight. -Dialysis tonight -Discontinue home furosemide '80mg'$  on Tuesday, Thursday, Saturday and Sunday  #Anemia of chronic kidney disease, chronic Patient's hemoglobin 8.2 on admission. Patient's hemoglobin appears to be 7-8 at baseline. Patient's anemia likely contributing to her weakness. -Continue management per nephrology  -Start aranesp with Monday dialysis session  #VTE ppx: Apixaban '5mg'$  twice daily #IVF: None #Diet: Renal with fluid restriction #Code status: Full Code  Prior to Admission Living  Arrangement: Home Anticipated Discharge Location: Home with home health services Barriers to Discharge: Continued medical workup Dispo: Anticipated discharge in approximately 1-2 day(s).   Cato Mulligan, MD 02/06/2021, 1:18 PM Pager: [phone] After 5pm on weekdays and 1pm on weekends: On Call pager [phone]

## 2021-02-07 DIAGNOSIS — R531 Weakness: Secondary | ICD-10-CM

## 2021-02-07 DIAGNOSIS — E1122 Type 2 diabetes mellitus with diabetic chronic kidney disease: Secondary | ICD-10-CM

## 2021-02-07 DIAGNOSIS — I120 Hypertensive chronic kidney disease with stage 5 chronic kidney disease or end stage renal disease: Secondary | ICD-10-CM

## 2021-02-07 DIAGNOSIS — N186 End stage renal disease: Secondary | ICD-10-CM

## 2021-02-07 LAB — GLUCOSE, CAPILLARY
Glucose-Capillary: 85 mg/dL (ref 70–99)
Glucose-Capillary: 92 mg/dL (ref 70–99)

## 2021-02-07 NOTE — Progress Notes (Signed)
Subjective: Said tolerated dialysis yesterday 5 L, unfortunately no standing weight denies shortness of breath or chest pain noted possible discharge home  Objective Vital signs in last 24 hours: Vitals:   02/06/21 2246 02/06/21 2304 02/07/21 0500 02/07/21 0933  BP:  138/72 (!) 142/77 132/78  Pulse: 80 75 72 100  Resp: '19 18 18 18  '$ Temp:  99.3 F (37.4 C) 98.2 F (36.8 C) 98.5 F (36.9 C)  TempSrc:  Oral Oral Oral  SpO2: 100% 99% 100% 100%  Weight:  115.6 kg    Height:       Weight change: -0.972 kg  Physical Exam: General: Alert obese female NAD Heart: RRR, no MRG Lungs: Decreased breath sounds bases otherwise CTA nonlabored breathing Abdomen: Obese, bowel sounds, +, ND NT, soft nontender nondistended, no ascites Extremities: Bipedal edema resolved dialysis Access: Positive bruit LUA AVF  Home meds: - asa 81/ pravastatin 5 qd/ sl ntg prn/ eliquis 5 bid - cardizem 120 qd/ lasix '80mg'$  nonhd days/ hydralazine 25 tid/ losartan 25 bid/ procardia cl 90 qd - allopurinol 100 qd/ prilosec 20 bid - eliquis 5 bid - neurontin 100 qd - prn's/ vitamins/ supplements  OP [id number] HD center in [facility] MWF 4hr EDW 117.5 2.0 k, 2.5 CA heparin 6,500 bolus, 400?hr  No ESA  Venofer 50 mg q wk Hec 3 mcg  q hd  Parsabid 2.'5mg'$   q hd   Problem/Plan: 1. Dyspnea - CXR/ CT chest and exam suggest vol overload / pulm edema.  losing body wt. needing lower EDW, no signs or symptoms of PNA.  2.8 L UF HD last evening  and UF again today  get volume down.needs wt= standing. yest 5 L UF tolerated fortunately no standing weight done new EDW probably 114.5 kg 2. ESRD - on HD x 39yr on MWF schedule [place]   Next hemodialysis tomorrow probably outpatient probable discharge today, OK for discharge per renal status  3. HTN - cont home meds, currently not able to taper any BP meds, UF on the HD, questionable if she needs Lasix 4. Atrial fib - per primary team 5. DM2 -management per admit  ,on po medication 6. OSA / pHTN - on CPAP at night, has home O2 to use prn 7. COPD 8. Chron diast CHF 9. 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 HO Covid  Feb 2022   DaErnest HaberPA-C [facility] [id number] 3590/29/2022,11:24 AM  LOS: 2 days   Labs: Basic Metabolic Panel: Recent Labs  Lab 02/05/21 1205 02/05/21 1306 02/06/21 0130  NA 139 141 137  K 3.8 3.9 3.2*  CL 104 103 101  CO2 24  --  29  GLUCOSE 142* 111* 82  BUN 31* 33* 6*  CREATININE 9.38* 10.00* 3.23*  CALCIUM 9.2  --  9.9  PHOS  --   --  2.6   Liver Function Tests: Recent Labs  Lab 02/06/21 0130  ALBUMIN 3.5   No results for input(s): LIPASE, AMYLASE in the last 168 hours. No results for input(s): AMMONIA in the last 168 hours. CBC: Recent Labs  Lab 02/05/21 1205 02/05/21 1306  WBC 5.4  --   HGB 8.2* 7.5*  HCT 25.6* 22.0*  MCV 92.1  --   PLT 231  --  Cardiac Enzymes: No results for input(s): CKTOTAL, CKMB, CKMBINDEX, TROPONINI in the last 168 hours. CBG: Recent Labs  Lab 02/05/21 1822 02/05/21 2036 02/06/21 0706 02/06/21 1723 02/07/21 0635  GLUCAP 85 166* 72 83 85    Studies/Results: DG Chest 2 View  Result Date: 02/05/2021 CLINICAL DATA:  Chest pain and shortness of breath. Bilateral leg weakness EXAM: CHEST - 2 VIEW COMPARISON:  12/17/2020 FINDINGS: Of the x-ray cardiac enlargement. Small bilateral pleural effusions. Pulmonary vascular congestion without overt edema. No airspace consolidation. Visualized osseous structures are unremarkable. IMPRESSION: 1. Cardiac enlargement, pulmonary vascular congestion and small pleural effusions. Correlate for any clinical signs or symptoms of early CHF. Electronically Signed   By: Kerby Moors M.D.   On: 02/05/2021 13:05   CT ANGIO CHEST PE W OR WO CONTRAST  Result Date:  02/05/2021 CLINICAL DATA:  65 year old female with shortness of breath and chest pain for 3 days. EXAM: CT ANGIOGRAPHY CHEST WITH CONTRAST TECHNIQUE: Multidetector CT imaging of the chest was performed using the standard protocol during bolus administration of intravenous contrast. Multiplanar CT image reconstructions and MIPs were obtained to evaluate the vascular anatomy. CONTRAST:  70m OMNIPAQUE IOHEXOL 350 MG/ML SOLN COMPARISON:  12/17/2020 chest CT, 02/05/2021 chest radiograph and prior studies FINDINGS: Cardiovascular: This is a technically borderline study with suboptimal contrast opacification and respiratory motion artifact in the LOWER lungs. No definite pulmonary emboli are identified. Cardiomegaly and very small pericardial effusion again noted. There is no evidence of thoracic aortic aneurysm. Mediastinum/Nodes: No enlarged mediastinal, hilar, or axillary lymph nodes. Thyroid gland, trachea, and esophagus demonstrate no significant findings. Lungs/Pleura: A small RIGHT pleural effusion and mild RIGHT basilar atelectasis noted. No airspace disease, consolidation, mass or pneumothorax noted. Mild interlobular septal thickening is unchanged. Upper Abdomen: A small hiatal hernia is noted. No acute abnormality noted. Musculoskeletal: No acute or suspicious bony abnormalities are identified. Review of the MIP images confirms the above findings. IMPRESSION: 1. Technically borderline study but no definite evidence of pulmonary emboli. 2. Small RIGHT pleural effusion and mild RIGHT basilar atelectasis. 3. Cardiomegaly and very small pericardial effusion, unchanged. 4. Small hiatal hernia. Electronically Signed   By: JMargarette CanadaM.D.   On: 02/05/2021 14:33   Medications:  . allopurinol  100 mg Oral Daily  . apixaban  5 mg Oral BID  . aspirin EC  81 mg Oral Daily  . Chlorhexidine Gluconate Cloth  6 each Topical Q0600  . cycloSPORINE  1 drop Both Eyes BID  . diltiazem  120 mg Oral Daily  . insulin aspart   0-6 Units Subcutaneous TID WC  . NIFEdipine  90 mg Oral Daily  . pantoprazole  40 mg Oral Daily  . pravastatin  5 mg Oral Daily

## 2021-02-07 NOTE — Plan of Care (Signed)

## 2021-02-07 NOTE — Progress Notes (Signed)
DISCHARGE NOTE HOME Nitya Molyneaux to be discharged home per MD order. Discussed prescriptions and follow up appointments with the patient. Prescriptions given to patient; medication list explained in detail. Patient verbalized understanding.  Skin clean, dry and intact without evidence of skin break down, no evidence of skin tears noted. IV catheter discontinued intact. Site without signs and symptoms of complications. Dressing and pressure applied. Pt denies pain at the site currently. No complaints noted.  Patient free of lines, drains, and wounds.   An After Visit Summary (AVS) was printed and given to the patient. Patient escorted via wheelchair, and discharged home via private auto.  Taziyah Iannuzzi S Jamelyn Bovard, RN

## 2021-02-07 NOTE — TOC Transition Note (Signed)
Transition of Care [facility]) - CM/SW Discharge Note   Patient Details  Name: Julie [name] MRN: [id number] Date of Birth: 17-Jun-1956  Transition of Care [facility] CM/SW Contact:  Bartholomew Crews, RN Phone Number: [phone] 02/07/2021, 10:30 AM   Clinical Narrative:     Patient to transition home today. Spoke with patient at the bedside. She has transportation home. She has home oxygen at home through [place]. Tub bench and rollator delivered to room by [employer]. Home Health arranged for PT only with [facility] - PT to assist patient with OT therapy needs. No CSW available to assist with community resources if needed. Discussed community resources and reaching out to DSS Education officer, museum. Patient aware of accepting agency and accepted discipline for [facility] PT. No further TOC needs identified at this time.   Final next level of care: [place] Barriers to Discharge: No Barriers Identified   Patient Goals and CMS Choice Patient states their goals for this hospitalization and ongoing recovery are:: return home CMS Medicare.gov Compare Post Acute Care list provided to:: Patient Choice offered to / list presented to : Patient  Discharge Placement                       Discharge Plan and Services In-house Referral: Clinical Social Work Discharge Planning Services: CM Consult Post Acute Care Choice: Home Health          DME Arranged: Marylou Mccoy bench DME Agency: [employer] Date DME Agency Contacted: 02/06/21 Time DME Agency Contacted: 1700 Representative spoke with at DME Agency: Freda Munro [place]: PT [place]: [facility] Date [place]: 02/07/21 Time [place]: 1029 Representative spoke with at [place]: [place] Interventions     Readmission Risk Interventions No flowsheet data found.

## 2021-02-07 NOTE — Progress Notes (Signed)
RT set up patient CPAP at beside. Patient stated she is able to place on self when ready.

## 2021-02-07 NOTE — Progress Notes (Signed)
Renal Navigator faxed discharge notes to patient's outpatient HD clinic/[facility] to provide continuity of care.   Alphonzo Cruise, [place] Renal Navigator [phone]

## 2021-02-07 NOTE — Discharge Instructions (Signed)
Ms.Julson,  We have arranged home health services. You should follow up with your primary care doctor in 1-2 weeks to discuss your hospital admission and to review your medication list.

## 2021-10-19 DIAGNOSIS — E66813 Obesity, class 3: Secondary | ICD-10-CM | POA: Insufficient documentation

## 2022-03-07 IMAGING — CT CT ANGIO CHEST
2 of 6 series · 18 of 46 positions shown · IV contrast (omnipaque)
Comparison: 12/17/2020

CLINICAL DATA: Short of breath and weakness for 4 days

EXAM:
CT ANGIOGRAPHY CHEST WITH CONTRAST
TECHNIQUE: Multidetector CT imaging of the chest was performed using the
standard protocol during bolus administration of intravenous
contrast. Multiplanar CT image reconstructions and MIPs were
obtained to evaluate the vascular anatomy.
CONTRAST:  100mL OMNIPAQUE IOHEXOL 350 MG/ML SOLN

[Series 7: thins · axial · 0.64mm/px · z∈[+1172,+1436]mm · 15 of 415 slices shown]
[im 19/415  lung]
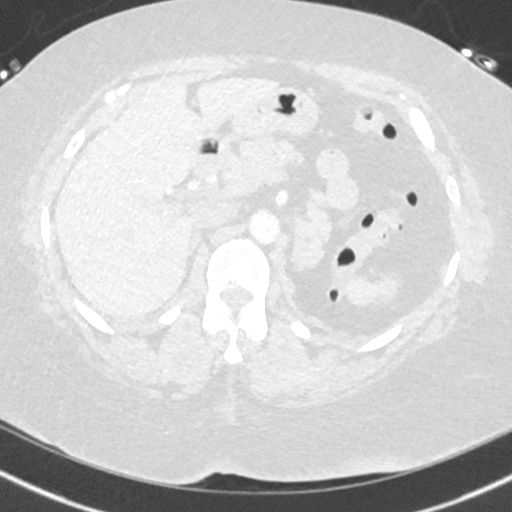
[im 55/415  soft-tissue]
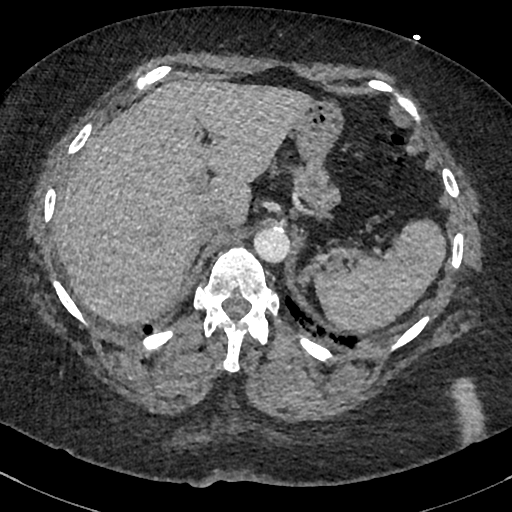
[im 73/415  lung]
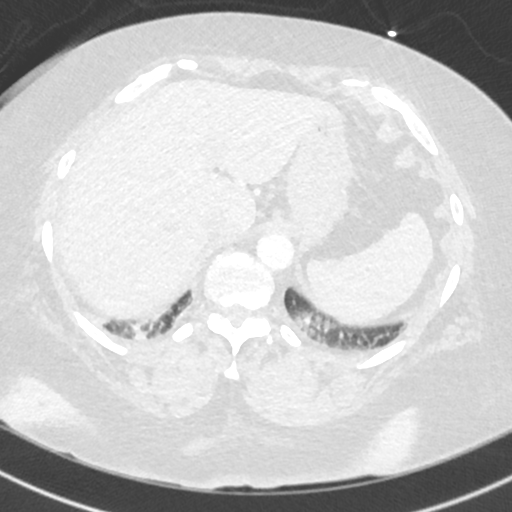
[im 109/415  soft-tissue]
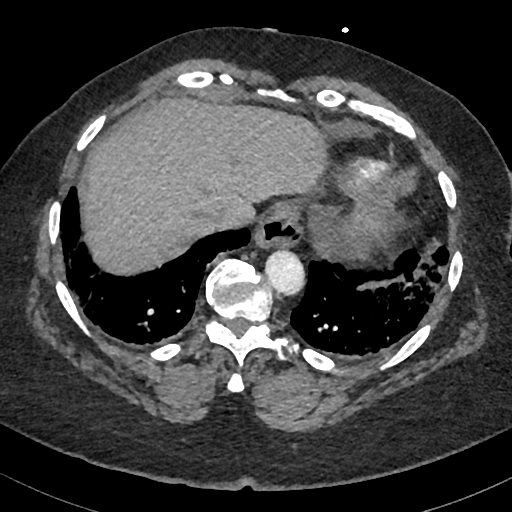
[im 127/415  lung]
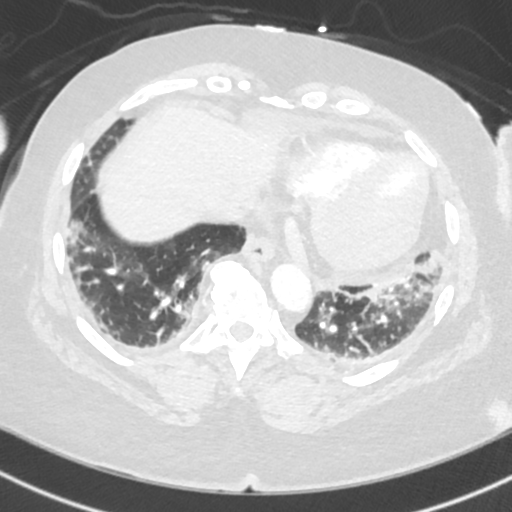
[im 163/415  soft-tissue]
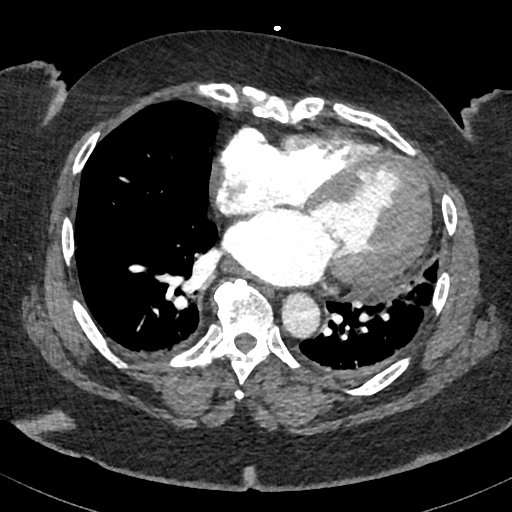
[im 181/415  lung]
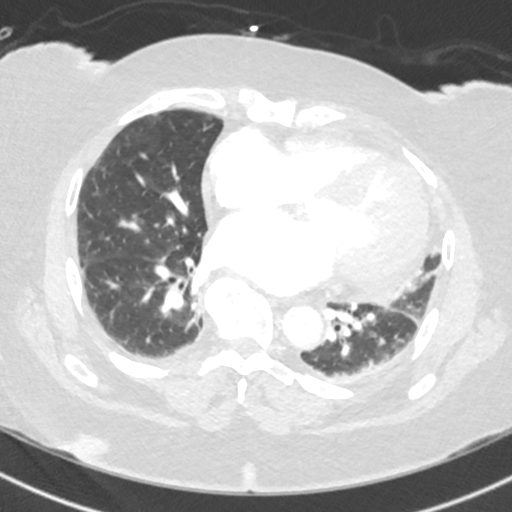
[im 217/415  soft-tissue]
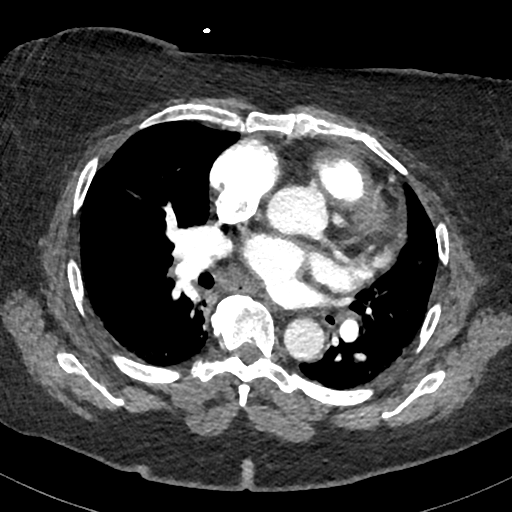
[im 235/415  lung]
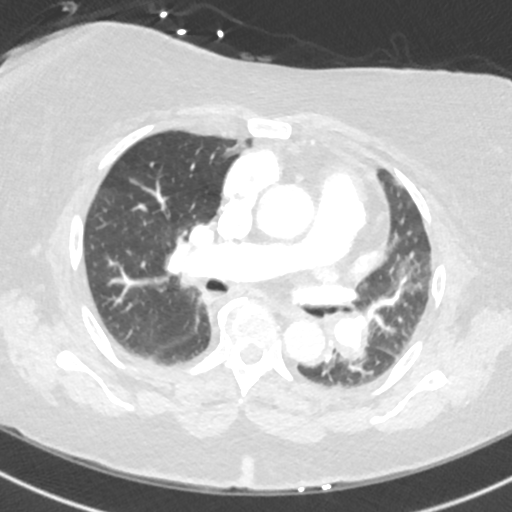
[im 253/415  soft-tissue]
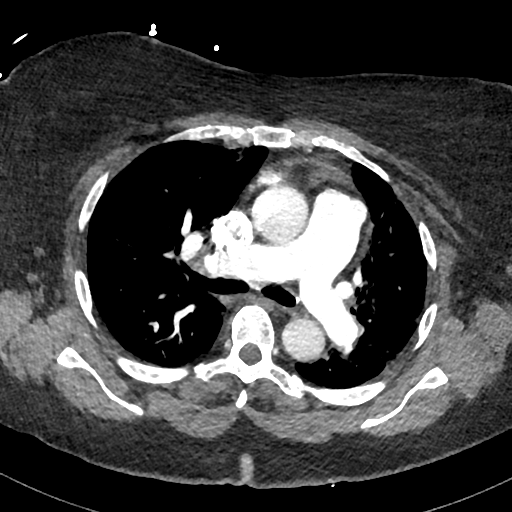
[im 289/415  lung]
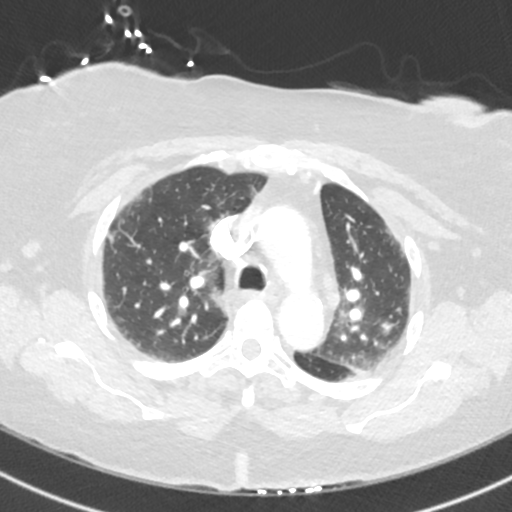
[im 307/415  soft-tissue]
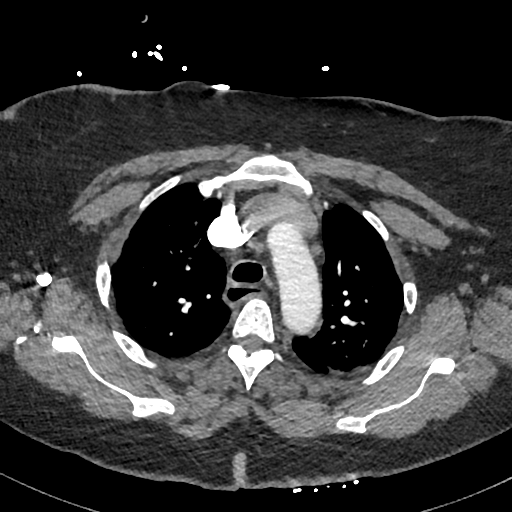
[im 343/415  lung]
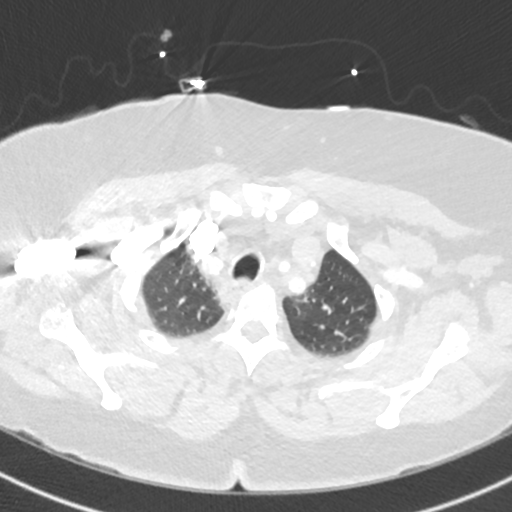
[im 361/415  soft-tissue]
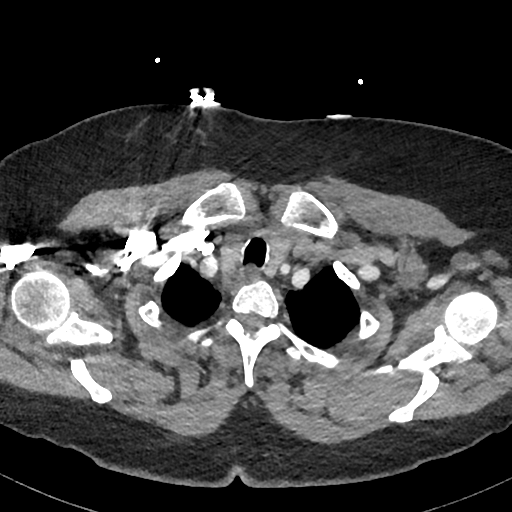
[im 397/415  lung]
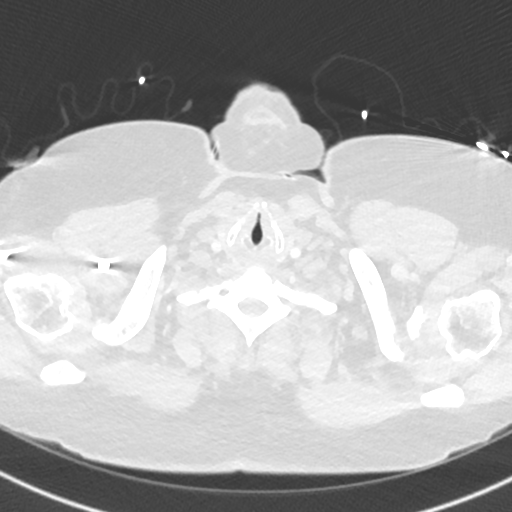

[Series 8: cor · coronal · 0.59mm/px · 3 of 157 slices shown]
[im 40/157  soft-tissue]
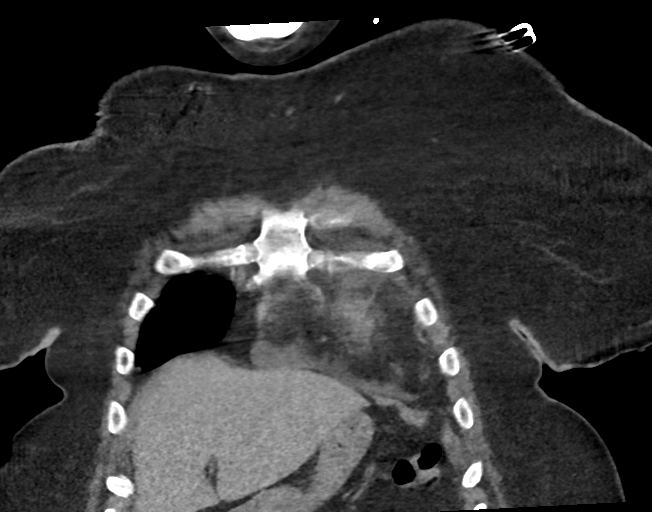
[im 79/157  soft-tissue]
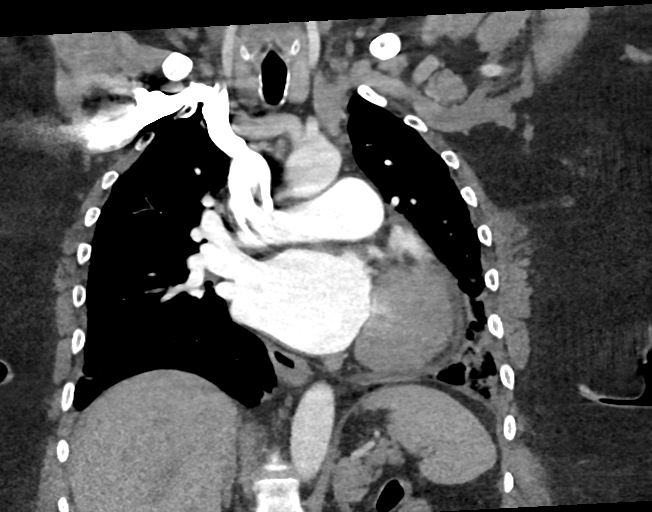
[im 118/157  soft-tissue]
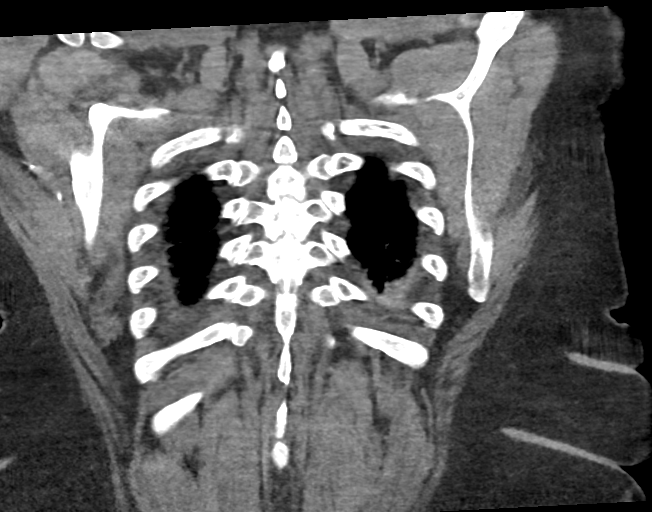

[18 of 46 positions shown; findings below may reference images not displayed]

FINDINGS: Cardiovascular: This is a technically adequate evaluation of the
pulmonary vasculature. No filling defects or pulmonary emboli.

Heart is enlarged without pericardial effusion. Prominent left
ventricular hypertrophy, with left apical thinning likely due to
prior infarct. No evidence of thoracic aortic aneurysm or
dissection. Mild atherosclerosis.

Mediastinum/Nodes: No enlarged mediastinal, hilar, or axillary lymph
nodes. Thyroid gland, trachea, and esophagus demonstrate no
significant findings.

Lungs/Pleura: Dependent hypoventilatory changes are seen at the lung
bases. There are minimal areas of subpleural ground-glass
consolidation which may reflect early edema or infection. No
effusion or pneumothorax. Central airways are patent.

Upper Abdomen: No acute abnormality.

Musculoskeletal: No acute or destructive bony lesion. Reconstructed
images demonstrate no additional findings.

Review of the MIP images confirms the above findings.
IMPRESSION: 1. No evidence of pulmonary embolus.
2. Minimal areas of subpleural ground-glass consolidation which may
reflect early edema or infection.
3. Cardiomegaly with left ventricular hypertrophy. Left apical mural
thinning may reflect sequela of previous infarct.
4. Aortic Atherosclerosis (OW12P-IT9.9).

## 2022-08-05 DIAGNOSIS — J9601 Acute respiratory failure with hypoxia: Secondary | ICD-10-CM | POA: Insufficient documentation

## 2022-08-05 DIAGNOSIS — U071 COVID-19: Secondary | ICD-10-CM | POA: Insufficient documentation

## 2022-10-10 DIAGNOSIS — J9691 Respiratory failure, unspecified with hypoxia: Secondary | ICD-10-CM | POA: Insufficient documentation

## 2022-10-13 ENCOUNTER — Other Ambulatory Visit: Payer: Self-pay

## 2022-10-13 ENCOUNTER — Encounter (HOSPITAL_COMMUNITY): Payer: Self-pay

## 2022-10-13 ENCOUNTER — Emergency Department (HOSPITAL_COMMUNITY): Payer: Medicare Other

## 2022-10-13 ENCOUNTER — Emergency Department (HOSPITAL_COMMUNITY)
Admission: EM | Admit: 2022-10-13 | Discharge: 2022-10-13 | Disposition: A | Discharge status: Home / Self Care | Payer: Medicare Other | Attending: Emergency Medicine | Admitting: Emergency Medicine

## 2022-10-13 DIAGNOSIS — R1013 Epigastric pain: Secondary | ICD-10-CM | POA: Insufficient documentation

## 2022-10-13 DIAGNOSIS — Z79899 Other long term (current) drug therapy: Secondary | ICD-10-CM | POA: Insufficient documentation

## 2022-10-13 DIAGNOSIS — N186 End stage renal disease: Secondary | ICD-10-CM | POA: Insufficient documentation

## 2022-10-13 DIAGNOSIS — R1033 Periumbilical pain: Secondary | ICD-10-CM | POA: Diagnosis not present

## 2022-10-13 DIAGNOSIS — Z7982 Long term (current) use of aspirin: Secondary | ICD-10-CM | POA: Insufficient documentation

## 2022-10-13 DIAGNOSIS — R1011 Right upper quadrant pain: Secondary | ICD-10-CM | POA: Diagnosis not present

## 2022-10-13 DIAGNOSIS — Z992 Dependence on renal dialysis: Secondary | ICD-10-CM | POA: Diagnosis not present

## 2022-10-13 DIAGNOSIS — R1010 Upper abdominal pain, unspecified: Secondary | ICD-10-CM | POA: Diagnosis present

## 2022-10-13 DIAGNOSIS — R1012 Left upper quadrant pain: Secondary | ICD-10-CM | POA: Diagnosis not present

## 2022-10-13 DIAGNOSIS — I509 Heart failure, unspecified: Secondary | ICD-10-CM | POA: Diagnosis not present

## 2022-10-13 LAB — COMPREHENSIVE METABOLIC PANEL
ALT: 12 U/L (ref 0–44)
AST: 14 U/L — ABNORMAL LOW (ref 15–41)
Albumin: 3.9 g/dL (ref 3.5–5.0)
Alkaline Phosphatase: 48 U/L (ref 38–126)
Anion gap: 19 — ABNORMAL HIGH (ref 5–15)
BUN: 26 mg/dL — ABNORMAL HIGH (ref 8–23)
CO2: 17 mmol/L — ABNORMAL LOW (ref 22–32)
Calcium: 9.4 mg/dL (ref 8.9–10.3)
Chloride: 99 mmol/L (ref 98–111)
Creatinine, Ser: 7.71 mg/dL — ABNORMAL HIGH (ref 0.44–1.00)
GFR, Estimated: 5 mL/min — ABNORMAL LOW (ref >60)
Glucose, Bld: 104 mg/dL — ABNORMAL HIGH (ref 70–99)
Potassium: 4 mmol/L (ref 3.5–5.1)
Sodium: 135 mmol/L (ref 135–145)
Total Bilirubin: 0.3 mg/dL (ref 0.3–1.2)
Total Protein: 7.2 g/dL (ref 6.5–8.1)

## 2022-10-13 LAB — CBC
HCT: 37 % (ref 36.0–46.0)
Hemoglobin: 11.6 g/dL — ABNORMAL LOW (ref 12.0–15.0)
MCH: 28.4 pg (ref 26.0–34.0)
MCHC: 31.4 g/dL (ref 30.0–36.0)
MCV: 90.7 fL (ref 80.0–100.0)
Platelets: 273 10*3/uL (ref 150–400)
RBC: 4.08 MIL/uL (ref 3.87–5.11)
RDW: 15.1 % (ref 11.5–15.5)
WBC: 7.3 10*3/uL (ref 4.0–10.5)
nRBC: 0 % (ref 0.0–0.2)

## 2022-10-13 LAB — LIPASE, BLOOD: Lipase: 45 U/L (ref 11–51)

## 2022-10-13 LAB — CBG MONITORING, ED: Glucose-Capillary: 88 mg/dL (ref 70–99)

## 2022-10-13 MED ORDER — ALUM & MAG HYDROXIDE-SIMETH 200-200-20 MG/5ML PO SUSP
30.0000 mL | Freq: Once | ORAL | Status: AC
  Administered 2022-10-13: 30 mL via ORAL
  Filled 2022-10-13: qty 30

## 2022-10-13 MED ORDER — LIDOCAINE VISCOUS HCL 2 % MT SOLN
15.0000 mL | Freq: Once | OROMUCOSAL | Status: AC
  Administered 2022-10-13: 15 mL via ORAL
  Filled 2022-10-13: qty 15

## 2022-10-13 MED ORDER — ACETAMINOPHEN 500 MG PO TABS
1000.0000 mg | ORAL_TABLET | Freq: Once | ORAL | Status: AC
  Administered 2022-10-13: 1000 mg via ORAL
  Filled 2022-10-13: qty 2

## 2022-10-13 NOTE — ED Notes (Signed)
Discharge instructions reviewed with patient. Patient denies any questions or concerns. Patient out to lobby via wheelchair. 

## 2022-10-13 NOTE — ED Triage Notes (Signed)
Pt arrived POV from home c/o generalized abdominal pain and N/V that started yesterday, pt states she also feels weak.

## 2022-10-13 NOTE — ED Provider Notes (Signed)
Julie [name]   CSN: [id number] Arrival date & time: 10/13/22  1123     History  Chief Complaint  Patient presents with   Abdominal Pain    Julie [name] is a 66 y.o. female.   Abdominal Pain    Patient with medical history of CHF, end-stage renal disease dialysis Monday, Wednesday, Friday, LBBB, spinal stenosis presents due to abdominal pain.  Patient states she was having abdominal pain while she was admitted to the ICU, she was discharged 4 days ago.  The pain worsened acutely yesterday, it is all across her upper abdomen without radiation elsewhere.  Associated with nausea and 1 episode of emesis earlier today.  Denies any diarrhea, she makes urine and small amounts.  States she has had a decrease dry weight since leaving the hospital and is worried she may be dehydrated.  States she was feeling worsening pain today on her way to a funeral to her granddaughter.  Denies any loss of consciousness, chest pain, shortness of breath.  Last dialysis yesterday.  Home Medications Prior to Admission medications   Medication Sig Start Date End Date Taking? Authorizing Provider  acetaminophen (TYLENOL) 500 MG tablet Take 500-1,000 mg by mouth daily as needed for pain.    [provider]  albuterol (PROVENTIL HFA;VENTOLIN HFA) 108 (90 BASE) MCG/ACT inhaler Inhale 2 puffs into the lungs every 4 (four) hours as needed for wheezing. 01/05/13   Evelina Bucy, MD  allopurinol (ZYLOPRIM) 100 MG tablet Take 100 mg by mouth daily. 10/21/18   [provider]  aspirin EC 81 MG tablet Take 81 mg by mouth daily.    [provider]  Calcium Carbonate 500 (200 Ca) MG WAFR Chew 2 tablets by mouth 3 (three) times daily with meals. 11/16/20   [provider]  cycloSPORINE (RESTASIS) 0.05 % ophthalmic emulsion Place 1 drop into both eyes in the morning and at bedtime. 09/03/18   [provider]  diltiazem (CARDIZEM  CD) 120 MG 24 hr capsule Take 120 mg by mouth daily. 12/09/20   [provider]  ELIQUIS 5 MG TABS tablet Take 5 mg by mouth 2 (two) times daily. 11/16/20   [provider]  fluticasone (FLONASE) 50 MCG/ACT nasal spray Place 2 sprays into the nose daily as needed for allergies.    [provider]  gabapentin (NEURONTIN) 100 MG capsule Take 100 mg by mouth daily. May take an additional 1 capsule four hours after dialysis. 08/16/20   [provider]  hydrOXYzine (ATARAX/VISTARIL) 10 MG tablet Take 5-10 mg by mouth daily as needed for itching. 05/26/20   [provider]  Melatonin 5 MG CAPS Take 5 mg by mouth at bedtime as needed (sleep).    [provider]  NIFEdipine (PROCARDIA XL/NIFEDICAL-XL) 90 MG 24 hr tablet Take 90 mg by mouth daily. 08/16/20   [provider]  nitroGLYCERIN (NITROSTAT) 0.4 MG SL tablet Place 0.4 mg under the tongue every 5 (five) minutes as needed for chest pain (1 tablet under tongue every 5 minutes as needed for chest pain).    Antony Salmon, MD  omeprazole (PRILOSEC) 20 MG capsule Take 20 mg by mouth 2 (two) times daily as needed (acid reflux).    [provider]  pravastatin (PRAVACHOL) 10 MG tablet Take 5 mg by mouth daily. 02/10/20   [provider]      Allergies    Iron, Atorvastatin, Carvedilol, Metoprolol, and Rosuvastatin    Review  of Systems   Review of Systems  Gastrointestinal:  Positive for abdominal pain.    Physical Exam Updated Vital Signs BP (!) 151/75   Pulse 65   Temp 98.1 F (36.7 C)   Resp (!) 22   Ht 5\' 5"  (1.651 m)   Wt 112 kg   SpO2 99%   BMI 41.10 kg/m  Physical Exam Vitals and nursing [name] reviewed. Exam conducted with a chaperone present.  Constitutional:      Appearance: Normal appearance.  HENT:     Head: Normocephalic and atraumatic.  Eyes:     General: No scleral icterus.       Right eye: No discharge.        Left eye: No discharge.      Extraocular Movements: Extraocular movements intact.     Pupils: Pupils are equal, round, and reactive to light.  Cardiovascular:     Rate and Rhythm: Normal rate and regular rhythm.     Pulses: Normal pulses.     Heart sounds: Normal heart sounds. No murmur heard.    No friction rub. No gallop.  Pulmonary:     Effort: Pulmonary effort is normal. No respiratory distress.     Breath sounds: Normal breath sounds.  Abdominal:     General: Abdomen is flat. Bowel sounds are normal. There is no distension.     Palpations: Abdomen is soft.     Tenderness: There is abdominal tenderness in the right upper quadrant, epigastric area, periumbilical area and left upper quadrant. There is no guarding or rebound.  Skin:    General: Skin is warm and dry.     Coloration: Skin is not jaundiced.     Comments: Left upper extremity with AV fistula with palpable thrill  Neurological:     Mental Status: She is alert. Mental status is at baseline.     Coordination: Coordination normal.     ED Results / Procedures / Treatments   Labs (all labs ordered are listed, but only abnormal results are displayed) Labs Reviewed  COMPREHENSIVE METABOLIC PANEL - Abnormal; Notable for the following components:      Result Value   CO2 17 (*)    Glucose, Bld 104 (*)    BUN 26 (*)    Creatinine, Ser 7.71 (*)    AST 14 (*)    GFR, Estimated 5 (*)    Anion gap 19 (*)    All other components within normal limits  CBC - Abnormal; Notable for the following components:   Hemoglobin 11.6 (*)    All other components within normal limits  LIPASE, BLOOD    EKG EKG Interpretation  Date/Time:  Saturday October 13 2022 11:27:38 EST Ventricular Rate:  73 PR Interval:  206 QRS Duration: 152 QT Interval:  482 QTC Calculation: 531 R Axis:   57 Text Interpretation: Normal sinus rhythm Left bundle branch block No significant change since last tracing When compared with ECG of 05-Feb-2021 11:56, PREVIOUS ECG IS PRESENT  Confirmed by Kommor, [place] ([id number]) on 10/13/2022 12:10:34 PM  Radiology CT ABDOMEN PELVIS WO CONTRAST  Result Date: 10/13/2022 CLINICAL DATA:  Abdominal pain, nausea, vomiting EXAM: CT ABDOMEN AND PELVIS WITHOUT CONTRAST TECHNIQUE: Multidetector CT imaging of the abdomen and pelvis was performed following the standard protocol without IV contrast. RADIATION DOSE REDUCTION: This exam was performed according to the departmental dose-optimization program which includes automated exposure control, adjustment of the mA and/or kV according to patient size and/or use of iterative reconstruction technique.  COMPARISON:  Renal sonogram done on 02/07/2009 FINDINGS: Lower chest: No acute findings are seen. Hepatobiliary: No focal abnormalities are seen in liver. There is no dilation of bile ducts. Gallbladder is not distended. Pancreas: No focal abnormalities are seen. Spleen: Unremarkable. Adrenals/Urinary Tract: Adrenals are unremarkable. Left kidney is not seen in its usual position. There is fusion of lower poles of both kidneys. Fused kidney is smaller than usual in size. There is 2.3 cm smooth marginated low-density structure in the left side of horseshoe kidney, possibly a cyst. In image 44 of series 3, there is 1.5 cm nodular density in the posterior margin of the horseshoe kidney with density measurements higher than usual for simple cysts. This may be renal cortex or a solid neoplastic process. There is another 1.5 cm area of inhomogeneous increased density in the anterior aspect of horseshoe kidney in midline. There is 1.3 cm fluid density structure in the horseshoe kidney in midline. There is no hydronephrosis. There are no renal or ureteral stones. Urinary bladder is unremarkable. Stomach/Bowel: Small hiatal hernia is seen. Stomach is not distended. Small bowel loops are not dilated. Appendix is not dilated. Appendix is noted with its tip in subhepatic location. There is no wall thickening in colon.  Vascular/Lymphatic: Scattered arterial calcifications are seen. Reproductive: Uterus is not seen. Other: There is no ascites or pneumoperitoneum. Musculoskeletal: Possible hemangioma is seen in the body of L3 vertebra. Schmorl's node is seen in the upper endplate of body of L2 vertebra. Degenerative changes are noted with spinal stenosis and encroachment of neural foramina at multiple levels. IMPRESSION: There is no evidence of intestinal obstruction or pneumoperitoneum. There is no hydronephrosis. Appendix is not dilated. 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 There are few low-density lesions, possibly cysts. There is 1.5 cm exophytic solid appearing lesion in the posterior margin of the horseshoe kidney. There is another 1.5 cm structure with inhomogeneous attenuation in the anterior aspect of the horseshoe kidney in midline. Findings may be related to congenital variation or suggest neoplastic process. Renal sonogram in nonemergent setting may be considered. Small hiatal hernia. Lumbar spondylosis. Other findings as described in the body of the report. Electronically Signed   By: Elmer Picker M.D.   On: 10/13/2022 13:32    Procedures Procedures    Medications Ordered in ED Medications  alum & mag hydroxide-simeth (MAALOX/MYLANTA) 200-200-20 MG/5ML suspension 30 mL (has no administration in time range)    And  lidocaine (XYLOCAINE) 2 % viscous mouth solution 15 mL (has no administration in time range)  acetaminophen (TYLENOL) tablet 1,000 mg (has no administration in time range)    ED Course/ Medical Decision Making/ A&P                           Medical Decision Making Risk OTC drugs. Prescription drug management.   Patient presents due to abdominal pain, nausea and vomiting.  Differential includes but not limited to acute intra-abdominal process, pancreatitis, sepsis, dehydration, AKI, metabolic abnormality, AAA, atypical ACS.  Reviewed external medical records including  her most recent hospitalization previous EKGs.  I ordered and reviewed and interpreted laboratory work-up.  CBC without leukocytosis, mild anemia with a hemoglobin 11.6.  Lipase within normal limits, CMP with a creatinine of 7.71 which actually improved from a few days ago.  Patient has a very slight anion gap at 19 which clinically is not particularly helpful.  CT abdomen pelvis without contrast ordered and viewed by myself.  Notable for acute kidney, there is a question of possible neoplastic disease.  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  Repeat abdominal exam is benign.  I considered additional work-up and mission but patient's vitals stable, not hypoxic.  Given negative imaging and reassuring laboratory work-up I do feel she is appropriate for close outpatient follow-up.  She is not having any chest pain and there is no ischemic changes on EKG so I do not feel this is ACS.  Strict return precautions were discussed with the patient who verbalized understanding.  Patient actually has an appointment with gastroenterology scheduled for 330 on Monday which I encouraged her to keep.        Final Clinical Impression(s) / ED Diagnoses Final diagnoses:  None    Rx / DC Orders ED Discharge Orders     None         Sherrill Raring, [place] 10/13/22 1456    Lacretia Leigh, MD 10/14/22 [phone]

## 2022-10-13 NOTE — ED Provider Triage Note (Signed)
Emergency Medicine Provider Triage Evaluation Note  Julie [name] , a 66 y.o. female  was evaluated in triage.  Pt complains of abdominal pain x 4 days, vomiting yesterday. Was just discharged from hospital 4 days ago after respiratory failure, hypertensive emergency, and NSTEMI. Had missed dialysis and presented with SOB. Last had dialysis yesterday, unsure if they took off too much fluid as she feels dehydrated. Feels very lightheaded when she stands up, no LOC.   Review of Systems  Positive: Generalized abd pain, vomiting, constipation (no BM in 2-3 weeks), lightheadedness, weakness Negative: Syncope, CP  Physical Exam  BP 127/83   Pulse 73   Temp 98.1 F (36.7 C)   Resp 16   SpO2 97%  Gen:   Awake, no distress   Resp:  Normal effort  MSK:   Moves extremities without difficulty  Other:    Medical Decision Making  Medically screening exam initiated at 12:09 PM.  Appropriate orders placed.  Marcelene Weidemann was informed that the remainder of the evaluation will be completed by another provider, this initial triage assessment does not replace that evaluation, and the importance of remaining in the ED until their evaluation is complete.  Workup initiated   Kateri Plummer, PA-C 10/13/22 1211

## 2022-10-13 NOTE — Discharge Instructions (Addendum)
You are seen today in the emergency department for upper abdominal pain.  As we discussed your work-up today was reassuring.  Please follow-up with your gastroenterologist, call Monday to confirm that you should have an appointment for 330 on Monday.  Return to the ED if you are unable to eat or drink, you develop a fever, you have chest pain or new or concerning symptoms.  Ct results:  CT ABDOMEN AND PELVIS WITHOUT CONTRAST    TECHNIQUE:  Multidetector CT imaging of the abdomen and pelvis was performed  following the standard protocol without IV contrast.    RADIATION DOSE REDUCTION: This exam was performed according to the  departmental dose-optimization program which includes automated  exposure control, adjustment of the mA and/or kV according to  patient size and/or use of iterative reconstruction technique.    COMPARISON:  Renal sonogram done on 02/07/2009    FINDINGS:  Lower chest: No acute findings are seen.    Hepatobiliary: No focal abnormalities are seen in liver. There is no  dilation of bile ducts. Gallbladder is not distended.    Pancreas: No focal abnormalities are seen.    Spleen: Unremarkable.    Adrenals/Urinary Tract: Adrenals are unremarkable. Left kidney is  not seen in its usual position. 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 There is  2.3 cm smooth marginated low-density structure in the left side of  horseshoe kidney, possibly a cyst. In image 44 of series 3, there is  1.5 cm nodular density in the posterior margin of the horseshoe  kidney with density measurements higher than usual for simple cysts.  This may be renal cortex or a solid neoplastic process. There is  another 1.5 cm area of inhomogeneous increased density in the  anterior aspect of horseshoe kidney in midline. There is 1.3 cm  fluid density structure in the horseshoe kidney in midline. There is  no hydronephrosis. There are no renal or  ureteral stones. Urinary  bladder is unremarkable.    Stomach/Bowel: Small hiatal hernia is seen. Stomach is not  distended. Small bowel loops are not dilated. Appendix is not  dilated. Appendix is noted with its tip in subhepatic location.  There is no wall thickening in colon.    Vascular/Lymphatic: Scattered arterial calcifications are seen.    Reproductive: Uterus is not seen.    Other: There is no ascites or pneumoperitoneum.    Musculoskeletal: Possible hemangioma is seen in the body of L3  vertebra. Schmorl's node is seen in the upper endplate of body of L2  vertebra. Degenerative changes are noted with spinal stenosis and  encroachment of neural foramina at multiple levels.    IMPRESSION:  There is no evidence of intestinal obstruction or pneumoperitoneum.  There is no hydronephrosis. Appendix is not dilated.    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 There are few  low-density lesions, possibly cysts. There is 1.5 cm exophytic solid  appearing lesion in the posterior margin of the horseshoe kidney.  There is another 1.5 cm structure with inhomogeneous attenuation in  the anterior aspect of the horseshoe kidney in midline. Findings may  be related to congenital variation or suggest neoplastic process.  Renal sonogram in nonemergent setting may be considered.    Small hiatal hernia. Lumbar spondylosis. Other findings as described  in the body of the report.

## 2022-12-21 DIAGNOSIS — H524 Presbyopia: Secondary | ICD-10-CM | POA: Insufficient documentation

## 2022-12-21 DIAGNOSIS — H25813 Combined forms of age-related cataract, bilateral: Secondary | ICD-10-CM | POA: Insufficient documentation

## 2023-04-10 DIAGNOSIS — R531 Weakness: Secondary | ICD-10-CM | POA: Insufficient documentation

## 2024-02-02 DIAGNOSIS — L720 Epidermal cyst: Secondary | ICD-10-CM | POA: Insufficient documentation

## 2024-02-02 DIAGNOSIS — Z1239 Encounter for other screening for malignant neoplasm of breast: Secondary | ICD-10-CM | POA: Insufficient documentation

## 2024-02-23 DIAGNOSIS — D045 Carcinoma in situ of skin of trunk: Secondary | ICD-10-CM | POA: Insufficient documentation

## 2024-07-25 DIAGNOSIS — L819 Disorder of pigmentation, unspecified: Secondary | ICD-10-CM | POA: Insufficient documentation

## 2024-07-25 DIAGNOSIS — D1801 Hemangioma of skin and subcutaneous tissue: Secondary | ICD-10-CM | POA: Insufficient documentation

## 2024-07-25 DIAGNOSIS — L814 Other melanin hyperpigmentation: Secondary | ICD-10-CM | POA: Insufficient documentation

## 2024-07-25 DIAGNOSIS — L658 Other specified nonscarring hair loss: Secondary | ICD-10-CM | POA: Insufficient documentation

## 2024-07-25 DIAGNOSIS — D229 Melanocytic nevi, unspecified: Secondary | ICD-10-CM | POA: Insufficient documentation

## 2024-07-25 DIAGNOSIS — L821 Other seborrheic keratosis: Secondary | ICD-10-CM | POA: Insufficient documentation

## 2024-09-19 ENCOUNTER — Ambulatory Visit: Admission: EM | Admit: 2024-09-19 | Discharge: 2024-09-19 | Disposition: A | Discharge status: Home / Self Care

## 2024-09-19 DIAGNOSIS — N186 End stage renal disease: Secondary | ICD-10-CM | POA: Diagnosis not present

## 2024-09-19 DIAGNOSIS — R1312 Dysphagia, oropharyngeal phase: Secondary | ICD-10-CM | POA: Insufficient documentation

## 2024-09-19 DIAGNOSIS — Z992 Dependence on renal dialysis: Secondary | ICD-10-CM | POA: Diagnosis not present

## 2024-09-19 DIAGNOSIS — Q631 Lobulated, fused and horseshoe kidney: Secondary | ICD-10-CM | POA: Insufficient documentation

## 2024-09-19 DIAGNOSIS — E7800 Pure hypercholesterolemia, unspecified: Secondary | ICD-10-CM | POA: Insufficient documentation

## 2024-09-19 DIAGNOSIS — K113 Abscess of salivary gland: Secondary | ICD-10-CM | POA: Diagnosis not present

## 2024-09-19 DIAGNOSIS — K219 Gastro-esophageal reflux disease without esophagitis: Secondary | ICD-10-CM | POA: Insufficient documentation

## 2024-09-19 DIAGNOSIS — N2581 Secondary hyperparathyroidism of renal origin: Secondary | ICD-10-CM | POA: Insufficient documentation

## 2024-09-19 DIAGNOSIS — H9203 Otalgia, bilateral: Secondary | ICD-10-CM | POA: Insufficient documentation

## 2024-09-19 DIAGNOSIS — H938X3 Other specified disorders of ear, bilateral: Secondary | ICD-10-CM | POA: Insufficient documentation

## 2024-09-19 DIAGNOSIS — D219 Benign neoplasm of connective and other soft tissue, unspecified: Secondary | ICD-10-CM | POA: Insufficient documentation

## 2024-09-19 DIAGNOSIS — M1990 Unspecified osteoarthritis, unspecified site: Secondary | ICD-10-CM | POA: Insufficient documentation

## 2024-09-19 DIAGNOSIS — H903 Sensorineural hearing loss, bilateral: Secondary | ICD-10-CM | POA: Insufficient documentation

## 2024-09-19 MED ORDER — METRONIDAZOLE 500 MG PO TABS: 500.0000 mg | ORAL_TABLET | Freq: Two times a day (BID) | ORAL | 0 refills | Status: AC

## 2024-09-19 MED ORDER — LEVOFLOXACIN 500 MG PO TABS
500.0000 mg | ORAL_TABLET | ORAL | 0 refills | Status: AC
Start: 2024-09-19 — End: 2024-09-24

## 2024-09-19 MED ORDER — CEFTRIAXONE SODIUM 1 G IJ SOLR
1.0000 g | Freq: Once | INTRAMUSCULAR | Status: AC
  Administered 2024-09-19: 1 g via INTRAMUSCULAR

## 2024-09-19 NOTE — ED Triage Notes (Signed)
 For about 3-5 days she has had shooting pain in her right ear and her right ear lobe has been swelling. She has been internally and externally. No interventions attempted.

## 2024-09-19 NOTE — ED Provider Notes (Signed)
 [name]    CSN: [id number] Arrival date & time: 09/19/24  9141    HISTORY   Chief Complaint  Patient presents with   Otalgia   HPI Julie [name] is a pleasant, 68 y.o. female who presents to urgent care today. Pt states that for about 3-5 days she has had shooting pain in her right ear and her right ear lobe has been swollen.  Reports no interventions attempted.  Denies hearing loss, drainage from right ear, fever, body aches, chills, sore throat.  The history is provided by the patient.  Otalgia  Past Medical History:  Diagnosis Date   Asthma    CHF (congestive heart failure) (HCC)    Gout    Hypertension    Neuropathy    Plantar fasciitis    Renal disorder    Patient Active Problem List   Diagnosis Date Noted   Ear fullness, bilateral 09/19/2024   Fibroid 09/19/2024   GERD (gastroesophageal reflux disease) 09/19/2024   Horseshoe kidney 09/19/2024   Hypercholesterolemia 09/19/2024   Arthritis 09/19/2024   Oropharyngeal dysphagia 09/19/2024   Otalgia of both ears 09/19/2024   Perceptive hearing loss, both sides 09/19/2024   Secondary hyperparathyroidism of renal origin 09/19/2024   Cherry angioma 07/25/2024   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   Hyperpigmentation of skin 07/25/2024   Lentigo 07/25/2024   Multiple benign nevi 07/25/2024   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) 10/19/2021   Leg weakness, bilateral 02/05/2021   ESRD (end stage renal disease) on  dialysis (HCC) 01/05/2021   Pneumonia due to COVID-19 virus 12/17/2020   Hemoptysis 12/15/2020   Pulmonary hypertension (HCC) 12/15/2020   (HFpEF) heart failure with preserved ejection fraction (HCC) 11/16/2020   Atrial fibrillation with RVR (HCC) 11/16/2020   Paroxysmal atrial fibrillation (HCC) 11/16/2020   Trigger finger, right index finger 12/04/2018   Trigger thumb of right hand 12/04/2018   Carpal tunnel syndrome on both sides 01/14/2018   Hand numbness 10/17/2017   Laryngitis 08/29/2017   Murmur 12/14/2016   Hemodialysis AV fistula thrombosis 09/19/2016   Type 2 diabetes mellitus with stage 4 chronic kidney disease, without long-term current use of insulin  (HCC) 04/05/2016   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 12/14/2014   Stiffness of joint, not elsewhere classified, pelvic region and thigh 06/01/2014   Lumbago 06/01/2014   Muscle weakness (generalized) 06/01/2014   Abnormality of gait 06/01/2014   Heartburn 04/28/2014   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   Hiatal hernia 03/12/2014   Monoclonal gammopathy 09/28/2013   Allergic rhinosinusitis 08/18/2013   Microcytic hypochromic anemia 08/18/2013   Sleep apnea, obstructive 08/04/2013   Vitamin D deficiency 07/23/2013   Spinal stenosis of lumbar region 07/16/2013   Morbidly obese (HCC) 04/29/2013   LVH (left ventricular hypertrophy) 04/12/2013   LBBB (left bundle branch block) 01/15/2013   HTN (hypertension) 01/15/2013   Diastolic CHF, chronic (HCC) 01/15/2013   Past Surgical History:  Procedure Laterality Date   ABDOMINAL HYSTERECTOMY  AV FISTULA PLACEMENT     COLONOSCOPY     OB History     Gravida  6   Para  4   Term  4   Preterm      AB  2   Living  4      SAB  2   IAB      Ectopic      Multiple      Live Births             Home Medications    Prior to Admission medications   Medication Sig Start Date End Date Taking? Authorizing Provider  Accu-Chek  Softclix Lancets lancets USE TO CHECK GLUCOSE TWICE DAILY AS NEEDED FOR SYMPTOMS OF LOW OR HIGH BLOOD SUGAR 06/13/23  Yes [provider]  acetaminophen  (TYLENOL ) 500 MG tablet Take 500-1,000 mg by mouth daily as needed for pain.   Yes [provider]  albuterol  (PROVENTIL  HFA;VENTOLIN  HFA) 108 (90 BASE) MCG/ACT inhaler Inhale 2 puffs into the lungs every 4 (four) hours as needed for wheezing. 01/05/13  Yes Elpidio Lamp, MD  allopurinol  (ZYLOPRIM ) 100 MG tablet Take 100 mg by mouth daily. 10/21/18  Yes [provider]  betamethasone valerate ointment (VALISONE) 0.1 % Apply directly to the scalp 3 times per week. Use daily for itch 07/22/24  Yes [provider]  Blood Glucose Monitoring Suppl (CHEMSTRIP BG LOG BOOK) MISC Glucometer - Preferred brand based on insurance and patient preference, Use to check BG BID to monitor for hyper or hypoglycemia.  ICD-10 Code: Type 2 - E11.9 11/07/23  Yes [provider]  Blood Glucose Monitoring Suppl (CHEMSTRIP BG LOG BOOK) MISC Lancets, Use to check BG BID to monitor for hyper or hypoglycemia, ICD-10 Code: Type 2 - E11.9 11/07/23  Yes [provider]  calcium  acetate (PHOSLO) 667 MG capsule Take 2,001 mg by mouth 3 (three) times daily. 09/09/24  Yes [provider]  calcium  acetate (PHOSLO) 667 MG tablet Take 1,334 mg by mouth. 06/05/24  Yes [provider]  cetirizine (ZYRTEC) 10 MG tablet Take 10 mg by mouth. 11/02/21  Yes [provider]  cloNIDine (CATAPRES - DOSED IN MG/24 HR) 0.1 mg/24hr patch Place 1 patch onto the skin. 08/20/24 08/20/25 Yes [provider]  cloNIDine (CATAPRES - DOSED IN MG/24 HR) 0.2 mg/24hr patch 0.2 mg once a week. 04/24/24  Yes [provider]  cycloSPORINE  (RESTASIS ) 0.05 % ophthalmic emulsion Place 1 drop into both eyes in the morning and at bedtime. 09/03/18  Yes [provider]  diclofenac Sodium (VOLTAREN) 1 % GEL Apply 2 g  topically. 12/03/22  Yes [provider]  ELIQUIS  5 MG TABS tablet Take 5 mg by mouth 2 (two) times daily. 11/16/20  Yes [provider]  ezetimibe (ZETIA) 10 MG tablet Take 10 mg by mouth daily. 08/20/24 08/20/25 Yes [provider]  fluticasone (FLONASE) 50 MCG/ACT nasal spray Place 2 sprays into the nose daily as needed for allergies.   Yes [provider]  folic acid (FOLVITE) 1 MG tablet Take 1 mg by mouth daily. 02/27/24  Yes [provider]  furosemide  (LASIX ) 80 MG tablet Take 1 tablet (80 mg) on non-dialysis days (TTSatSun)    Nephrology recommending to continue. 08/05/22  Yes [provider]  glucose blood (PRECISION QID TEST) test strip 1 each. 01/03/23  Yes [provider]  glucose blood (PRECISION QID TEST) test strip 1 Application by Other route. 01/03/23  Yes [provider]  hydrOXYzine  (ATARAX /VISTARIL ) 10 MG tablet Take 5-10 mg by mouth daily as needed for itching. 05/26/20  Yes [provider]  Lancets (FREESTYLE) lancets 1 Application by Other route. 01/03/23  Yes [provider]  levofloxacin (LEVAQUIN) 500 MG tablet Take 1 tablet (500 mg total) by mouth every other day for 5 days. Take each dose after completing hemodialysis 09/19/24 09/24/24 Yes Joesph Shaver Scales, PA-C  linaclotide [facility]) 145 MCG CAPS capsule Take one capsule on an empty stomach 30 minutes prior to first meal of the day. Keep medicine in its original container. 07/15/24  Yes [provider]  losartan  (COZAAR ) 100 MG tablet Take 100 mg by mouth. 10/10/22  Yes [provider]  Melatonin 5 MG CAPS Take 5 mg by mouth at bedtime as needed (sleep).   Yes [provider]  metroNIDAZOLE (FLAGYL) 500 MG tablet Take 1 tablet (500 mg total) by mouth 2 (two) times daily for 10 days. 09/19/24 09/29/24 Yes Joesph Shaver Scales, PA-C  NIFEdipine  (PROCARDIA  XL/NIFEDICAL-XL) 90 MG 24 hr tablet Take 90 mg by mouth  daily. 08/16/20  Yes [provider]  nitroGLYCERIN (NITROSTAT) 0.4 MG SL tablet Place 0.4 mg under the tongue every 5 (five) minutes as needed for chest pain (1 tablet under tongue every 5 minutes as needed for chest pain).   Yes Pinky Anes, MD  omeprazole (PRILOSEC) 20 MG capsule Take 20 mg by mouth 2 (two) times daily as needed (acid reflux).   Yes [provider]  ondansetron  (ZOFRAN -ODT) 4 MG disintegrating tablet Take 4 mg by mouth. Patient taking differently: Take 4 mg by mouth daily as needed. 10/15/22  Yes [provider]  OZEMPIC, 2 MG/DOSE, 8 MG/3ML SOPN Inject 2 mg into the skin. 07/10/24  Yes [provider]  pantoprazole  (PROTONIX ) 40 MG tablet Take 40 mg by mouth. 10/15/22 02/26/25 Yes [provider]  pravastatin  (PRAVACHOL ) 10 MG tablet Take 5 mg by mouth daily. 02/10/20  Yes [provider]  valACYclovir (VALTREX) 500 MG tablet Take 500 mg by mouth. 12/20/22  Yes [provider]  B Complex-C-Folic Acid (DIALYVITE 800 PO) Take 0.8 mg by mouth.    [provider]    Family History Family History  Problem Relation Age of Onset   Stroke Mother    Social History Social History   Tobacco Use   Smoking status: Former    Current packs/day: 0.00    Average packs/day: 1 pack/day for 38.0 years (38.0 ttl pk-yrs)    Types: Cigarettes    Start date: 08/18/1966    Quit date: 08/18/2004    Years since quitting: 20.1    Passive exposure: Past   Smokeless tobacco: Never  Vaping Use   Vaping status: Never Used  Substance Use Topics   Alcohol use: No   Drug use: No   Allergies   Iron, Latex, Atorvastatin , Carvedilol, Metoprolol, and Rosuvastatin  Review of Systems Review of Systems  HENT:  Positive for ear pain.    Pertinent findings revealed after performing a 14 point review of systems has been noted in the history of present illness.  Physical Exam Vital Signs BP (!) 162/82 (BP Location: Right Arm)    Pulse 85   Temp 98.9 F (37.2 C) (Oral)   Resp 15   SpO2 95%   No data found.  Physical Exam Vitals and nursing note reviewed.  Constitutional:      General: She is awake. She is not in acute distress.  Appearance: Normal appearance. She is well-developed and well-groomed. She is not ill-appearing.  HENT:     Head: Normocephalic and atraumatic.     Salivary Glands: Right salivary gland is not diffusely enlarged or tender. Left salivary gland is not diffusely enlarged or tender.      Right Ear: Hearing, tympanic membrane, ear canal and external ear normal.     Left Ear: Hearing, tympanic membrane, ear canal and external ear normal.     Nose: Nose normal.     Right Turbinates: Not enlarged, swollen or pale.     Left Turbinates: Not enlarged, swollen or pale.     Right Sinus: No maxillary sinus tenderness or frontal sinus tenderness.     Left Sinus: No maxillary sinus tenderness or frontal sinus tenderness.     Mouth/Throat:     Lips: Pink. No lesions.     Mouth: Mucous membranes are moist. No oral lesions.     Tongue: No lesions. Tongue does not deviate from midline.     Palate: No mass and lesions.     Pharynx: Oropharynx is clear. Uvula midline. No pharyngeal swelling, oropharyngeal exudate, posterior oropharyngeal erythema, uvula swelling or postnasal drip.     Tonsils: No tonsillar exudate. 0 on the right. 0 on the left.  Eyes:     General: Lids are normal.        Right eye: No discharge.        Left eye: No discharge.     Conjunctiva/sclera: Conjunctivae normal.     Right eye: Right conjunctiva is not injected.     Left eye: Left conjunctiva is not injected.  Neck:     Trachea: Trachea and phonation normal.  Cardiovascular:     Rate and Rhythm: Normal rate and regular rhythm.  Pulmonary:     Effort: Pulmonary effort is normal.     Breath sounds: Normal breath sounds.  Chest:     Chest wall: No tenderness.  Musculoskeletal:        General: Normal range of motion.      Cervical back: Full passive range of motion without pain, normal range of motion and neck supple. Normal range of motion.  Lymphadenopathy:     Cervical: Cervical adenopathy present.     Right cervical: Superficial cervical adenopathy present.  Skin:    General: Skin is warm and dry.     Findings: No erythema or rash.  Neurological:     General: No focal deficit present.     Mental Status: She is alert and oriented to person, place, and time. Mental status is at baseline.  Psychiatric:        Attention and Perception: Attention and perception normal.        Mood and Affect: Mood and affect normal.        Speech: Speech normal.        Behavior: Behavior normal. Behavior is cooperative.        Thought Content: Thought content normal.     Visual Acuity Right Eye Distance:   Left Eye Distance:   Bilateral Distance:    Right Eye Near:   Left Eye Near:    Bilateral Near:     UC Couse / Diagnostics / Procedures:     Radiology No results found.  Procedures Procedures (including critical care time) EKG  Pending results:  Labs Reviewed - No data to display  Medications Ordered in UC: Medications  cefTRIAXone (ROCEPHIN) injection 1 g (1 g Intramuscular Given 09/19/24  9048)    UC Diagnoses / Final Clinical Impressions(s)   I have reviewed the triage vital signs and the nursing notes.  Pertinent labs & imaging results that were available during my care of the patient were reviewed by me and considered in my medical decision making (see chart for details).    Final diagnoses:  Abscess of parotid gland  ESRD on dialysis [facility])   Physical exam findings concerning for abscess of parotid gland.  Patient provided with an injection of ceftriaxone during her visit today and advised to begin levofloxacin and metronidazole.  Dosage adjustments have been made given patient's known history of ESRD on hemodialysis.  Conservative care recommended.  Return precautions advised.  Please  see discharge instructions below for details of plan of care as provided to patient. ED Prescriptions     Medication Sig Dispense Auth. Provider   levofloxacin (LEVAQUIN) 500 MG tablet Take 1 tablet (500 mg total) by mouth every other day for 5 days. Take each dose after completing hemodialysis 5 tablet Joesph Shaver Scales, PA-C   metroNIDAZOLE (FLAGYL) 500 MG tablet Take 1 tablet (500 mg total) by mouth 2 (two) times daily for 10 days. 20 tablet Joesph Shaver Scales, PA-C      PDMP not reviewed this encounter.  Pending results:  Labs Reviewed - No data to display    Discharge Instructions      For treatment of the infection in your left sided parotid gland, you received an injection of ceftriaxone which is a strong antibiotic that should quickly eliminate excess bacteria that is causing your swelling and pain.  Is very important that you continue another 10 days worth of antibiotics to completely resolve this infection.  Because you are on dialysis, I would like for you to take a tablet of levofloxacin after each of your next 5 rounds of dialysis for 5 doses in total.  I would like for you to begin taking metronidazole 1 tablet 3 times daily for the next 10 days as well.  This dose of metronidazole does not need to be adjusted for your dialysis but is very important that you do let your dialysis provider know that you are taking it so they can adjust the dialysis.  It is also important that you do not take Ozempic for 3 days after beginning metronidazole to allow the metronidazole to completely clear your system.  If you have not seeing meaningful improvement of the pain and swelling in the next 2 to 3 days, I do recommend that you go to the emergency room for further evaluation.  They may wish to perform a CT scan of your glands and provide you with IV antibiotics which may work better than the ones I provided for you today.  Thank you for visiting [facility] today.   We appreciate the opportunity to participate in your care and hope you feel better soon.    Disposition Upon Discharge:  Condition: stable for discharge home  Patient presented with an acute illness with associated systemic symptoms and significant discomfort requiring urgent management. In my opinion, this is a condition that a prudent lay person (someone who possesses an average knowledge of health and medicine) may potentially expect to result in complications if not addressed urgently such as respiratory distress, impairment of bodily function or dysfunction of bodily organs.   Routine symptom specific, illness specific and/or disease specific instructions were discussed with the patient and/or caregiver at length.   As such, the patient  has been evaluated and assessed, work-up was performed and treatment was provided in alignment with urgent care protocols and evidence based medicine.  Patient/parent/caregiver has been advised that the patient may require follow up for further testing and treatment if the symptoms continue in spite of treatment, as clinically indicated and appropriate.  Patient/parent/caregiver has been advised to return to the [facility] or PCP if no better; to PCP or the Emergency Department if new signs and symptoms develop, or if the current signs or symptoms continue to change or worsen for further workup, evaluation and treatment as clinically indicated and appropriate  The patient will follow up with their current PCP if and as advised. If the patient does not currently have a PCP we will assist them in obtaining one.   The patient may need specialty follow up if the symptoms continue, in spite of conservative treatment and management, for further workup, evaluation, consultation and treatment as clinically indicated and appropriate.  Patient/parent/caregiver verbalized understanding and agreement of plan as discussed.  All questions were addressed during visit.  Please see  discharge instructions below for further details of plan.  This office note has been dictated using Teaching laboratory technician.  Unfortunately, this method of dictation can sometimes lead to typographical or grammatical errors.  I apologize for your inconvenience in advance if this occurs.  Please do not hesitate to reach out to me if clarification is needed.      Joesph Shaver Scales, PA-C 09/19/24 1239

## 2024-09-19 NOTE — Discharge Instructions (Addendum)
 For treatment of the infection in your left sided parotid gland, you received an injection of ceftriaxone which is a strong antibiotic that should quickly eliminate excess bacteria that is causing your swelling and pain.  Is very important that you continue another 10 days worth of antibiotics to completely resolve this infection.  Because you are on dialysis, I would like for you to take a tablet of levofloxacin after each of your next 5 rounds of dialysis for 5 doses in total.  I would like for you to begin taking metronidazole 1 tablet 3 times daily for the next 10 days as well.  This dose of metronidazole does not need to be adjusted for your dialysis but is very important that you do let your dialysis provider know that you are taking it so they can adjust the dialysis.  It is also important that you do not take Ozempic for 3 days after beginning metronidazole to allow the metronidazole to completely clear your system.  If you have not seeing meaningful improvement of the pain and swelling in the next 2 to 3 days, I do recommend that you go to the emergency room for further evaluation.  They may wish to perform a CT scan of your glands and provide you with IV antibiotics which may work better than the ones I provided for you today.  Thank you for visiting [facility] today.  We appreciate the opportunity to participate in your care and hope you feel better soon.
